# Patient Record
Sex: Female | Born: 1935 | Race: White | Hispanic: No | Smoking: Never smoker
Health system: Southern US, Community
[De-identification: ages and names within clinical notes are randomized; demographics above are authoritative.]

## PROBLEM LIST (undated history)

## (undated) DIAGNOSIS — M25561 Pain in right knee: Secondary | ICD-10-CM

## (undated) DIAGNOSIS — K649 Unspecified hemorrhoids: Secondary | ICD-10-CM

## (undated) DIAGNOSIS — Z0000 Encounter for general adult medical examination without abnormal findings: Secondary | ICD-10-CM

## (undated) DIAGNOSIS — R413 Other amnesia: Secondary | ICD-10-CM

## (undated) DIAGNOSIS — L219 Seborrheic dermatitis, unspecified: Secondary | ICD-10-CM

## (undated) DIAGNOSIS — I10 Essential (primary) hypertension: Secondary | ICD-10-CM

## (undated) DIAGNOSIS — L729 Follicular cyst of the skin and subcutaneous tissue, unspecified: Secondary | ICD-10-CM

## (undated) DIAGNOSIS — N390 Urinary tract infection, site not specified: Secondary | ICD-10-CM

## (undated) HISTORY — DX: Urinary tract infection, site not specified: N39.0

## (undated) HISTORY — DX: Seborrheic dermatitis, unspecified: L21.9

## (undated) HISTORY — DX: Encounter for general adult medical examination without abnormal findings: Z00.00

## (undated) HISTORY — PX: OTHER SURGICAL HISTORY: SHX169

## (undated) HISTORY — DX: Unspecified hemorrhoids: K64.9

## (undated) HISTORY — DX: Pain in right knee: M25.561

## (undated) HISTORY — DX: Follicular cyst of the skin and subcutaneous tissue, unspecified: L72.9

## (undated) HISTORY — DX: Other amnesia: R41.3

## (undated) HISTORY — DX: Essential (primary) hypertension: I10

---

## 2010-09-16 ENCOUNTER — Encounter: Payer: Self-pay | Admitting: Internal Medicine

## 2010-09-16 ENCOUNTER — Ambulatory Visit
Admission: RE | Admit: 2010-09-16 | Discharge: 2010-09-16 | Payer: Self-pay | Source: Home / Self Care | Attending: Family Medicine | Admitting: Family Medicine

## 2010-09-16 DIAGNOSIS — I10 Essential (primary) hypertension: Secondary | ICD-10-CM | POA: Insufficient documentation

## 2010-09-16 DIAGNOSIS — I83893 Varicose veins of bilateral lower extremities with other complications: Secondary | ICD-10-CM | POA: Insufficient documentation

## 2010-09-16 DIAGNOSIS — D126 Benign neoplasm of colon, unspecified: Secondary | ICD-10-CM | POA: Insufficient documentation

## 2010-09-16 DIAGNOSIS — K4020 Bilateral inguinal hernia, without obstruction or gangrene, not specified as recurrent: Secondary | ICD-10-CM | POA: Insufficient documentation

## 2010-09-16 DIAGNOSIS — M818 Other osteoporosis without current pathological fracture: Secondary | ICD-10-CM | POA: Insufficient documentation

## 2010-09-16 LAB — CONVERTED CEMR LAB
BUN: 29 mg/dL — ABNORMAL HIGH (ref 6–23)
Basophils Relative: 1 % (ref 0–1)
Calcium: 9.9 mg/dL (ref 8.4–10.5)
Creatinine, Ser: 0.87 mg/dL (ref 0.40–1.20)
Eosinophils Absolute: 0.2 10*3/uL (ref 0.0–0.7)
Eosinophils Relative: 3 % (ref 0–5)
Hemoglobin: 13 g/dL (ref 12.0–15.0)
MCHC: 33.2 g/dL (ref 30.0–36.0)
MCV: 86.3 fL (ref 78.0–100.0)
Monocytes Absolute: 0.7 10*3/uL (ref 0.1–1.0)
Monocytes Relative: 9 % (ref 3–12)
RBC: 4.53 M/uL (ref 3.87–5.11)

## 2010-09-17 DIAGNOSIS — M810 Age-related osteoporosis without current pathological fracture: Secondary | ICD-10-CM | POA: Insufficient documentation

## 2010-09-19 ENCOUNTER — Telehealth: Payer: Self-pay

## 2010-09-29 NOTE — Assessment & Plan Note (Signed)
Summary: new to est//ccm   Vital Signs:  Patient profile:   77 year old female Menstrual status:  postmenopausal Height:      61.5 inches Weight:      120 pounds BMI:     22.39 Pulse rate:   84 / minute BP sitting:   116 / 80  (left arm)  Vitals Entered By: Kyung Rudd, CMA (September 16, 2010 2:55 PM) CC: NP     Menstrual Status postmenopausal   CC:  NP.  History of Present Illness: Pt presents to clinic to establish primary care. H/o HTN well controlled. Compliant with medication without adverse effect. Notes previous hypokalemia requiring k supplementation. H/o bilateral inguinal hernias without pain. Recall colonoscopy  ~5 y ago with "benign" polyps and believes may be due. May not be interested in repeating procedure at this time.  Was dx'ed with osteoporosis with last BMD ? ~2011. No recent fx hx. Was previously tx'ed with fosamax however states this was dc'ed. Denies having had side effects from medication. Has bilateral le venous varicosities R>L with mild intermittent swelling without pain. No other complaints  Preventive Screening-Counseling & Management  Alcohol-Tobacco     Smoking Status: never     Smoking Cessation Counseling: no     Tobacco Counseling: not indicated; no tobacco use  Caffeine-Diet-Exercise     Does Patient Exercise: yes  Problems Prior to Update: 1)  Long-term (CURRENT) Use of Other Medications  (ICD-V58.69) 2)  Hypertension  (ICD-401.9) 3)  Family History Diabetes 1st Degree Relative  (ICD-V18.0)  Current Problems (verified): 1)  Colonic Polyps, Benign  (ICD-211.3) 2)  Varicose Veins Lower Extremities W/oth Comps  (ICD-454.8) 3)  Other Osteoporosis  (ICD-733.09) 4)  Long-term (CURRENT) Use of Other Medications  (ICD-V58.69) 5)  Hypertension  (ICD-401.9) 6)  Family History Diabetes 1st Degree Relative  (ICD-V18.0)  Allergies (verified): 1)  ! Penicillin V Potassium (Penicillin V Potassium)  Past History:  Past Medical  History: Hypertension Osteoporosis  Family History: Family History Diabetes   Social History: Retired Conservation officer, nature Never Smoked Alcohol use-yes Regular exercise-yes Smoking Status:  never Does Patient Exercise:  yes  Review of Systems      See HPI General:  Denies chills, fever, and sweats. Eyes:  Denies eye irritation, eye pain, and red eye. ENT:  Denies decreased hearing, difficulty swallowing, and earache. CV:  Complains of swelling of feet; denies chest pain or discomfort, fainting, fatigue, and near fainting. Resp:  Denies chest discomfort, cough, and shortness of breath. GI:  Denies abdominal pain, bloody stools, change in bowel habits, and loss of appetite. GU:  Denies dysuria, incontinence, and nocturia. MS:  Denies joint pain, joint redness, joint swelling, and cramps. Derm:  Denies changes in color of skin, flushing, and rash. Neuro:  Denies difficulty with concentration, disturbances in coordination, falling down, and headaches. Psych:  Denies anxiety, depression, and mental problems. Endo:  Denies cold intolerance, heat intolerance, and polyuria. Heme:  Denies abnormal bruising, bleeding, and fevers. Allergy:  Denies hives or rash, persistent infections, and seasonal allergies.  Physical Exam  General:  Well-developed,well-nourished,in no acute distress; alert,appropriate and cooperative throughout examination Head:  Normocephalic and atraumatic without obvious abnormalities. No apparent alopecia or balding. Eyes:  pupils equal, pupils round, corneas and lenses clear, and no injection.   Ears:  no external deformities.   Nose:  no external deformity, no external erythema, and no nasal discharge.   Mouth:  Oral mucosa and oropharynx without lesions or exudates.  Teeth in good  repair. Neck:  No deformities, masses, or tenderness noted. Lungs:  Normal respiratory effort, chest expands symmetrically. Lungs are clear to auscultation, no crackles or wheezes. Heart:   Normal rate and regular rhythm. S1 and S2 normal without gallop, murmur, click, rub or other extra sounds. Abdomen:  Bowel sounds positive,abdomen soft and non-tender without masses, organomegaly or hernias noted. +bilateral inguinal hernias easily reducible. NT without erythema. No inguinal LN palpable Msk:  normal ROM, no joint swelling, and no joint warmth.   Extremities:  trace left pedal edema.  +venous varicosities R>L. No warmth, redness or wounds Neurologic:  alert & oriented X3 and gait normal.   Skin:  turgor normal, color normal, and no rashes.   Cervical Nodes:  No lymphadenopathy noted Inguinal Nodes:  No significant adenopathy Psych:  Oriented X3, normally interactive, good eye contact, not anxious appearing, and not depressed appearing.     Impression & Recommendations:  Problem # 1:  HYPERTENSION (ICD-401.9) Assessment Unchanged Normotensive and stable. Continue current regimen. Obtain cbc, chem7.  Her updated medication list for this problem includes:    Atenolol-chlorthalidone 50-25 Mg Tabs (Atenolol-chlorthalidone) .Marland Kitchen... 1/2 tab once daily  Orders: TLB-CBC Platelet - w/Differential (85025-CBCD) Specimen Handling (04540) Venipuncture (98119)  Problem # 2:  OSTEOPOROSIS (ICD-733.00) Assessment: Unchanged No recent fx hx off bisphosphonate. Reviewed ca/vit d dosing. BMD reportedly utd 2011  Problem # 3:  INGUINAL HERNIAS, BILATERAL (ICD-550.92) Assessment: Unchanged Stable and asx. No current indication for surgery. Reviewed protential warning signs to prompt immediate medical evaluation  Problem # 4:  COLONIC POLYPS, BENIGN (ICD-211.3) Assessment: Unchanged ? due for repeat colonoscopy. Pt currently weighing whether to proceed. Given advanced age did discuss further colonoscopies unlikely to contribute to longevity.  Complete Medication List: 1)  Atenolol-chlorthalidone 50-25 Mg Tabs (Atenolol-chlorthalidone) .... 1/2 tab once daily  Other Orders: TLB-BMP  (Basic Metabolic Panel-BMET) (80048-METABOL)  Patient Instructions: 1)  Please schedule a follow-up appointment in 3 months. Prescriptions: ATENOLOL-CHLORTHALIDONE 50-25 MG TABS (ATENOLOL-CHLORTHALIDONE) 1/2 tab once daily  #30 x 3   Entered by:   Kyung Rudd, CMA   Authorized by:   Edwyna Perfect MD   Signed by:   Kyung Rudd, CMA on 09/16/2010   Method used:   Printed then faxed to ...         RxID:   [id number]    Orders Added: 1)  TLB-CBC Platelet - w/Differential [85025-CBCD] 2)  TLB-BMP (Basic Metabolic Panel-BMET) [80048-METABOL] 3)  Specimen Handling [99000] 4)  Venipuncture [84696] 5)  New Patient Level IV [29528]

## 2010-09-29 NOTE — Progress Notes (Signed)
----   Converted from flag ---- ---- 09/17/2010 12:27 PM, Edwyna Perfect MD wrote: labs nl including potassium. ------------------------------  Phone Note Outgoing Call   Call placed by: Kyung Rudd, CMA,  September 19, 2010 9:25 AM Call placed to: Patient Details for Reason: lab results Summary of Call: left message to call back Initial call taken by: Kyung Rudd, CMA,  September 19, 2010 9:25 AM  Follow-up for Phone Call        pt aware Follow-up by: Kyung Rudd, CMA,  September 19, 2010 4:35 PM

## 2010-11-11 ENCOUNTER — Encounter: Payer: Self-pay | Admitting: Internal Medicine

## 2010-12-20 ENCOUNTER — Ambulatory Visit (INDEPENDENT_AMBULATORY_CARE_PROVIDER_SITE_OTHER): Payer: Medicare Other | Admitting: Internal Medicine

## 2010-12-20 ENCOUNTER — Encounter: Payer: Self-pay | Admitting: Internal Medicine

## 2010-12-20 VITALS — BP 122/80 | HR 63 | Wt 121.0 lb

## 2010-12-20 DIAGNOSIS — K4020 Bilateral inguinal hernia, without obstruction or gangrene, not specified as recurrent: Secondary | ICD-10-CM

## 2010-12-20 DIAGNOSIS — I10 Essential (primary) hypertension: Secondary | ICD-10-CM

## 2010-12-20 DIAGNOSIS — I8390 Asymptomatic varicose veins of unspecified lower extremity: Secondary | ICD-10-CM

## 2010-12-20 DIAGNOSIS — I83893 Varicose veins of bilateral lower extremities with other complications: Secondary | ICD-10-CM

## 2010-12-20 DIAGNOSIS — M810 Age-related osteoporosis without current pathological fracture: Secondary | ICD-10-CM

## 2010-12-20 DIAGNOSIS — H01009 Unspecified blepharitis unspecified eye, unspecified eyelid: Secondary | ICD-10-CM

## 2010-12-20 MED ORDER — SULFACETAMIDE-PREDNISOLONE 10-0.2 % OP OINT: TOPICAL_OINTMENT | Freq: Three times a day (TID) | OPHTHALMIC | Status: AC

## 2010-12-20 MED ORDER — ATENOLOL-CHLORTHALIDONE 50-25 MG PO TABS: 0.5000 | ORAL_TABLET | Freq: Every day | ORAL | Status: DC

## 2010-12-24 ENCOUNTER — Encounter: Payer: Self-pay | Admitting: Internal Medicine

## 2010-12-24 DIAGNOSIS — H01009 Unspecified blepharitis unspecified eye, unspecified eyelid: Secondary | ICD-10-CM | POA: Insufficient documentation

## 2010-12-24 NOTE — Assessment & Plan Note (Signed)
Normotensive and stable. Continue current regimen. 

## 2010-12-24 NOTE — Assessment & Plan Note (Signed)
Asymptomatic, stable and reducible. Recommend observation. Warning signs and symptoms provided.

## 2010-12-24 NOTE — Assessment & Plan Note (Signed)
Attempt Blephamide.Followup if no improvement or worsening.

## 2010-12-24 NOTE — Progress Notes (Signed)
  Subjective:    Patient ID: Sheryl Lang, female    DOB: 1920/11/07, 77 y.o.   MRN: [id number]  HPI Patient presents to clinic for evaluation of leg pain. Patient with chronic history of right leg greater than left leg varicosities. Has associated pain and swelling. Exacerbated by standing. Blood pressure viewed as normal and weight is stable. Has left inguinal hernia remains asymptomatic without pain redness firmness or warmth. Reviewed normal CBC and Chem- 7 from last visit. 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 Previously received topical treatment which improved the symptoms. Does not have the medication any longer. No other complaints.  Reviewed past medical history, medications and allergies    Review of Systems ee history of present illness     Objective:   Physical Exam   Physical Exam  Vitals reviewed. Constitutional:  appears well-developed and well-nourished. No distress.  HENT:  Head: Normocephalic and atraumatic.  Right Ear: Tympanic membrane, external ear and ear canal normal.  Left Ear: Tympanic membrane, external ear and ear canal normal.  Nose: Nose normal.  Mouth/Throat: Oropharynx is clear and moist. No oropharyngeal exudate.  Eyes: Conjunctivae and EOM are normal. Pupils are equal, round, and reactive to light. Right eye exhibits no discharge. Left eye exhibits no discharge. No scleral icterus.  Neck: Neck supple. No thyromegaly present.  Cardiovascular: Normal rate, regular rhythm and normal heart sounds.  Exam reveals no gallop and no friction rub.   No murmur heard. Pulmonary/Chest: Effort normal and breath sounds normal. No respiratory distress.  has no wheezes.  has no rales.  Lymphadenopathy:   no cervical adenopathy.  Neurological:  is alert.  Skin: Skin is warm and dry.  not diaphoretic.  Psychiatric: normal mood and affect.  Extremities: Right lower leg demonstrates multiple significant varicosities without warmth.  Positive mild tenderness.No palpable cords GU: Left inguinal hernia noted on Valsalva. Easily reduced. No tenderness warmth or firmness noted   Assessment & Plan:

## 2010-12-24 NOTE — Assessment & Plan Note (Signed)
Symptomatic varicose veins.Schedule vascular surgery consult

## 2011-02-01 ENCOUNTER — Encounter: Payer: 59 | Admitting: Vascular Surgery

## 2011-02-01 ENCOUNTER — Telehealth: Payer: Self-pay | Admitting: Internal Medicine

## 2011-02-01 NOTE — Telephone Encounter (Signed)
Pt is needing to get a referral to a podiatrist re: seed corns on soles of feet. Pls advise who pcp would recommend.

## 2011-02-06 ENCOUNTER — Other Ambulatory Visit: Payer: Self-pay | Admitting: Internal Medicine

## 2011-02-06 DIAGNOSIS — L84 Corns and callosities: Secondary | ICD-10-CM

## 2011-02-06 NOTE — Telephone Encounter (Signed)
Referral order placed.

## 2011-02-07 ENCOUNTER — Encounter: Payer: 59 | Admitting: Vascular Surgery

## 2011-03-29 ENCOUNTER — Encounter: Payer: Self-pay | Admitting: Podiatry

## 2011-04-06 ENCOUNTER — Ambulatory Visit (INDEPENDENT_AMBULATORY_CARE_PROVIDER_SITE_OTHER): Payer: Medicare Other | Admitting: Family Medicine

## 2011-04-06 ENCOUNTER — Telehealth: Payer: Self-pay | Admitting: Internal Medicine

## 2011-04-06 ENCOUNTER — Encounter: Payer: Self-pay | Admitting: Family Medicine

## 2011-04-06 DIAGNOSIS — R413 Other amnesia: Secondary | ICD-10-CM

## 2011-04-06 DIAGNOSIS — G4700 Insomnia, unspecified: Secondary | ICD-10-CM

## 2011-04-06 DIAGNOSIS — I10 Essential (primary) hypertension: Secondary | ICD-10-CM

## 2011-04-06 DIAGNOSIS — M549 Dorsalgia, unspecified: Secondary | ICD-10-CM

## 2011-04-06 DIAGNOSIS — N390 Urinary tract infection, site not specified: Secondary | ICD-10-CM

## 2011-04-06 LAB — POCT URINALYSIS DIPSTICK
Bilirubin, UA: NEGATIVE
Glucose, UA: NEGATIVE
Nitrite, UA: NEGATIVE
Spec Grav, UA: 1.01

## 2011-04-06 MED ORDER — IBUPROFEN 200 MG PO TABS: ORAL_TABLET | ORAL | Status: DC

## 2011-04-06 MED ORDER — DIPHENHYDRAMINE HCL 25 MG PO CAPS: 25.0000 mg | ORAL_CAPSULE | Freq: Every evening | ORAL | Status: DC | PRN

## 2011-04-06 MED ORDER — ASPIRIN EC 81 MG PO TBEC: 81.0000 mg | DELAYED_RELEASE_TABLET | Freq: Every day | ORAL | Status: AC

## 2011-04-06 MED ORDER — ACETAMINOPHEN 500 MG PO TABS: ORAL_TABLET | ORAL | Status: DC

## 2011-04-06 MED ORDER — CIPROFLOXACIN HCL 250 MG PO TABS: ORAL_TABLET | ORAL | Status: AC

## 2011-04-06 NOTE — Telephone Encounter (Signed)
Yesterday late afternoon patient called  Her daughter and said she was all mixed up.  Was confused about what she had done that day and she could not remember her friends at the home where she lives.  Her memory is not great to begin with but this was not the same,  It was an abrupt change in memory.  Mother said she felt fine and had no headache and no slack face.  She sounds better this am but is still not back to where she should be.  Since Dr Rodena Medin is not in should daughter take her to see Dr Abner Greenspan today .  Mom is going to the nurse at [facility] and will have her blood pressure checked.  Please advise what to do.

## 2011-04-06 NOTE — Patient Instructions (Signed)
Urinary Tract Infection (UTI) Infections of the urinary tract can start in several places. A bladder infection (cystitis), a kidney infection (pyelonephritis), and a prostate infection (prostatitis) are different types of urinary tract infections. They usually get better if treated with medicines (antibiotics) that kill germs. Take all the medicine until it is gone. You or your child may feel better in a few days, but TAKE ALL MEDICINE or the infection may not respond and may become more difficult to treat. HOME CARE INSTRUCTIONS  Drink enough water and fluids to keep the urine clear or pale yellow. Cranberry juice is especially recommended, in addition to large amounts of water.   Avoid caffeine, tea, and carbonated beverages. They tend to irritate the bladder.   Alcohol may irritate the prostate.   Only take over-the-counter or prescription medicines for pain, discomfort, or fever as directed by your caregiver.  FINDING OUT THE RESULTS OF YOUR TEST Not all test results are available during your visit. If your or your child's test results are not back during the visit, make an appointment with your caregiver to find out the results. Do not assume everything is normal if you have not heard from your caregiver or the medical facility. It is important for you to follow up on all test results. TO PREVENT FURTHER INFECTIONS:  Empty the bladder often. Avoid holding urine for long periods of time.   After a bowel movement, women should cleanse from front to back. Use each tissue only once.   Empty the bladder before and after sexual intercourse.  SEEK MEDICAL CARE IF:  There is back pain..   Your baby is older than 3 months with a rectal temperature of 100.5 F (38.1 C) or higher for more than 1 day.   Your or your child's problems (symptoms) are no better in 3 days. Return sooner if you or your child is getting worse.  SEEK IMMEDIATE MEDICAL CARE IF:  There is severe back pain or lower  abdominal pain  Your baby is older than 3 months with a rectal temperature of 102 F (38.9 C) or higher.   Your baby is 91 months old or younger with a rectal temperature of 100.4 F (38 C) or higher.   There is nausea or vomiting.   There is continued burning or discomfort with urination.  MAKE SURE YOU:  Understand these instructions.   Will watch this condition.   Will get help right away if you or your child is not doing well or gets worse.  Document Released: 05/24/2005 Document Re-Released: 11/08/2009 [facility] Patient Information 2011 [place], [place].  Tylenol/Acetaminophen 500mg  tabs, max of 4 tabs in 24 hours Advil/Motrin/Ibuprofen 200mg  only for severe pain, max of 2 tabs in 24 hours

## 2011-04-06 NOTE — Telephone Encounter (Signed)
Call returned to [phone], spoke with patients daughter and stated she is having trouble with her short term memory, she denies any physical changes, dizziness, nausea, and no vomiting. She states patient seems better today, however there is still some noticeable confusion. Patients daughter was advised to have her evaluated in ER of office visit. Sheryl Lang has verbalized understanding and agrees to have patient evaluated. She was informed Dr Rodena Medin was not in office today, and was offered appointment with another provider, which she has accepted.

## 2011-04-07 ENCOUNTER — Encounter: Payer: Self-pay | Admitting: Family Medicine

## 2011-04-07 DIAGNOSIS — R413 Other amnesia: Secondary | ICD-10-CM

## 2011-04-07 HISTORY — DX: Other amnesia: R41.3

## 2011-04-07 NOTE — Assessment & Plan Note (Addendum)
Patient is brought in by her daughter state secondary to an episode of confusion yesterday. 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 Has had slow study memory loss but yesterday had acute onset of confusion. Somewhere mid afternoon she took a nap and woke up very confused. Was unable to tell her daughter who she had lunch with. Could not remember who her neighbor was. This lasted a couple of hours and then cleared. Today she is clear there was one episode in March with similar symptoms occurred. Likely multifactorial and not urine dip is consistent with UTI we will treat this and they will notify the office if symptoms worsen or recur. We'll seek immediate care issues memory lapses of other neurologic symptoms are concerning. We will start an 81 mg aspirin daily in the meantime and consider further workup if symptoms worsen.

## 2011-04-07 NOTE — Assessment & Plan Note (Signed)
Adequately controlled today, no changes to regimen

## 2011-04-07 NOTE — Progress Notes (Signed)
Sheryl Lang [id number] [birth year] 04/07/2011      Progress Note-Follow Up  Subjective  Chief Complaint  Chief Complaint  Patient presents with  . Memory Loss    sudden loss/ confusion (yesterday)    HPI  Patient is an 77 year old Caucasian female was brought in today by her daughter. They're concerned about an episode of transient memory loss yesterday. He reports home she seemed normal in the morning went to lunch in the afternoon and intubated. Upon awakening from her neck she was confused and disoriented. She went to lunch with her neighbor was. This lasted several hours and then cleared and she's had no recurrence. They do believe a similar episode occurred back in March of 2012 they don't think it lasted quite as long. She's had no falls or trauma. She offers no other neurologic complaints such as difficulty with word finding, headaches, sensory loss or falls. She is noting some urinary urgency upon questioning but denies dysuria or any new urinary symptoms otherwise. She does trouble with constipation at times but this is not notably worsened lately. No diarrhea, fevers, chills, chest pain, palpitations, shortness of breath. She does note he takes occasional Benadryl about half a tablet at bedtime when she has trouble sleeping but had not taken his prior to this episode.  Past Medical History  Diagnosis Date  . Osteoporosis   . Hypertension   . Transient memory loss 04/07/2011    History reviewed. No pertinent past surgical history.  Family History  Problem Relation Age of Onset  . Diabetes      family hx    History   Social History  . Marital Status: Widowed    Spouse Name: N/A    Number of Children: N/A  . Years of Education: N/A   Occupational History  . Not on file.   Social History Main Topics  . Smoking status: Never Smoker   . Smokeless tobacco: Not on file  . Alcohol Use: Yes  . Drug Use:   . Sexually Active:    Other Topics Concern  . Not on file    Social History Narrative  . No narrative on file    Current Outpatient Prescriptions on File Prior to Visit  Medication Sig Dispense Refill  . atenolol-chlorthalidone (TENORETIC) 50-25 MG per tablet Take 0.5 tablets by mouth daily.  90 tablet  3    Allergies  Allergen Reactions  . Penicillins     Review of Systems  Review of Systems  Constitutional: Negative for fever, chills and malaise/fatigue.  HENT: Negative for hearing loss, congestion, sore throat and tinnitus.   Eyes: Negative for discharge.  Respiratory: Negative for shortness of breath.   Cardiovascular: Negative for chest pain, palpitations and leg swelling.  Gastrointestinal: Negative for nausea, abdominal pain and diarrhea.  Genitourinary: Positive for urgency. Negative for dysuria, frequency and hematuria.  Musculoskeletal: Negative for falls.  Skin: Negative for rash.  Neurological: Negative for dizziness, tingling, tremors, sensory change, speech change, focal weakness, seizures, loss of consciousness, weakness and headaches.  Endo/Heme/Allergies: Negative for polydipsia.  Psychiatric/Behavioral: Positive for memory loss. Negative for depression and suicidal ideas. The patient has insomnia. The patient is not nervous/anxious.        Transient and last several hours yesterday. A and O x 3 today    Objective  BP 130/77  Pulse 54  Temp(Src) 97.9 F (36.6 C) (Oral)  Ht 5' 1.5" (1.562 m)  Wt 125 lb 12.8 oz (57.063 kg)  BMI 23.38 kg/m2  SpO2 97%  Physical Exam  Physical Exam  Constitutional: She is oriented to person, place, and time and well-developed, well-nourished, and in no distress. No distress.  HENT:  Head: Normocephalic and atraumatic.  Eyes: Conjunctivae are normal.  Neck: Neck supple. No thyromegaly present.  Cardiovascular: Normal rate, regular rhythm and normal heart sounds.   No murmur heard. Pulmonary/Chest: Effort normal and breath sounds normal. She has no wheezes.  Abdominal: Bowel  sounds are normal. She exhibits no distension and no mass. There is no tenderness.  Musculoskeletal: She exhibits no edema.  Lymphadenopathy:    She has no cervical adenopathy.  Neurological: She is alert and oriented to person, place, and time. She has normal reflexes. She displays normal reflexes. No cranial nerve deficit. Gait normal. Coordination normal.  Skin: Skin is warm and dry. No rash noted. She is not diaphoretic.  Psychiatric: Mood, memory, affect and judgment normal.    No results found for this basename: TSH   Lab Results  Component Value Date   WBC 7.0 09/16/2010   HGB 13.0 09/16/2010   HCT 39.1 09/16/2010   MCV 86.3 09/16/2010   PLT 240 09/16/2010   Lab Results  Component Value Date   CREATININE 0.87 09/16/2010   BUN 29* 09/16/2010   NA 140 09/16/2010   K 4.0 09/16/2010   CL 99 09/16/2010   CO2 29 09/16/2010    Assessment & Plan  Transient memory loss Patient is brought in by her daughter state secondary to an episode of confusion yesterday. 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 Has had slow study memory loss but yesterday had acute onset of confusion. Somewhere mid afternoon she took a nap and woke up very confused. Was unable to tell her daughter who she had lunch with. Could not remember who her neighbor was. This lasted a couple of hours and then cleared. Today she is clear there was one episode in March with similar symptoms occurred. Likely multifactorial and not urine dip is consistent with UTI we will treat this and they will notify the office if symptoms worsen or recur. We'll seek immediate care issues memory lapses of other neurologic symptoms are concerning. We will start an 81 mg aspirin daily in the meantime and consider further workup if symptoms worsen.   HYPERTENSION Adequately controlled today, no changes to regimen

## 2011-04-18 ENCOUNTER — Encounter: Payer: Self-pay | Admitting: *Deleted

## 2011-04-21 ENCOUNTER — Encounter: Payer: Self-pay | Admitting: Internal Medicine

## 2011-04-21 ENCOUNTER — Ambulatory Visit (INDEPENDENT_AMBULATORY_CARE_PROVIDER_SITE_OTHER): Payer: Medicare Other | Admitting: Internal Medicine

## 2011-04-21 DIAGNOSIS — F29 Unspecified psychosis not due to a substance or known physiological condition: Secondary | ICD-10-CM

## 2011-04-21 DIAGNOSIS — Z79899 Other long term (current) drug therapy: Secondary | ICD-10-CM

## 2011-04-21 DIAGNOSIS — I10 Essential (primary) hypertension: Secondary | ICD-10-CM

## 2011-04-21 DIAGNOSIS — R413 Other amnesia: Secondary | ICD-10-CM | POA: Insufficient documentation

## 2011-04-21 DIAGNOSIS — R410 Disorientation, unspecified: Secondary | ICD-10-CM

## 2011-04-21 LAB — BASIC METABOLIC PANEL WITH GFR
BUN: 26 mg/dL — ABNORMAL HIGH (ref 6–23)
CO2: 28 meq/L (ref 19–32)
Calcium: 10.7 mg/dL — ABNORMAL HIGH (ref 8.4–10.5)
Chloride: 99 meq/L (ref 96–112)
Creat: 0.91 mg/dL (ref 0.50–1.10)
Glucose, Bld: 93 mg/dL (ref 70–99)
Potassium: 4 meq/L (ref 3.5–5.3)
Sodium: 136 meq/L (ref 135–145)

## 2011-04-21 LAB — HEPATIC FUNCTION PANEL
ALT: 35 U/L (ref 0–35)
AST: 37 U/L (ref 0–37)
Albumin: 4.6 g/dL (ref 3.5–5.2)
Alkaline Phosphatase: 61 U/L (ref 39–117)
Total Protein: 7.3 g/dL (ref 6.0–8.3)

## 2011-04-21 LAB — VITAMIN B12: Vitamin B-12: 912 pg/mL — ABNORMAL HIGH (ref 211–911)

## 2011-04-21 LAB — CBC
HCT: 40.4 % (ref 36.0–46.0)
Hemoglobin: 13.4 g/dL (ref 12.0–15.0)
MCH: 28.7 pg (ref 26.0–34.0)
MCHC: 33.2 g/dL (ref 30.0–36.0)
MCV: 86.5 fL (ref 78.0–100.0)
Platelets: 254 K/uL (ref 150–400)
RBC: 4.67 MIL/uL (ref 3.87–5.11)
RDW: 14.2 % (ref 11.5–15.5)
WBC: 7.2 K/uL (ref 4.0–10.5)

## 2011-04-21 MED ORDER — ATENOLOL-CHLORTHALIDONE 50-25 MG PO TABS: 0.5000 | ORAL_TABLET | Freq: Every day | ORAL | Status: DC

## 2011-04-21 NOTE — Assessment & Plan Note (Signed)
Resolved. Possible contribution from uti but urine cx unremarkable. Consider possible tia. Continue asa. Neurologically stable and asx.

## 2011-04-21 NOTE — Assessment & Plan Note (Signed)
Stable and felt to be mild by patient and family. Not interested in medication. Obtain b12 level.

## 2011-04-21 NOTE — Progress Notes (Signed)
  Subjective:    Patient ID: Sheryl Lang, female    DOB: Jan 15, 1921, 77 y.o.   MRN: [id number]  HPI Pt presents to clinic for followup of multiple medical problems. Several weeks ago experienced several hours of confusion after awakening from a nap. No associated neurologic deficits. Next day returned to baseline. No further episodes. Was evaluated by Dr. Abner Greenspan with suspicion for possible uti. Completed course of cipro and now takes asa 81mg  daily as well. Does have chronic memory loss but felt to be mild by pt and her daughter. BP reviewed under good control. Compliant with medication without adverse effect. No other complaints.  Past Medical History  Diagnosis Date  . Osteoporosis   . Hypertension   . Transient memory loss 04/07/2011   No past surgical history on file.  reports that she has never smoked. She has never used smokeless tobacco. She reports that she drinks alcohol. Her drug history not on file. family history includes Diabetes in an unspecified family member. Allergies  Allergen Reactions  . Penicillins      Review of Systems see hpi     Objective:   Physical Exam  Physical Exam  Nursing note and vitals reviewed. Constitutional: Appears well-developed and well-nourished. No distress.  HENT:  Head: Normocephalic and atraumatic.  Right Ear: External ear normal.  Left Ear: External ear normal.  Eyes: Conjunctivae are normal. No scleral icterus.  Neck: Neck supple. Carotid bruit is not present.  Cardiovascular: Normal rate, regular rhythm and normal heart sounds.  Exam reveals no gallop and no friction rub.   No murmur heard. Pulmonary/Chest: Effort normal and breath sounds normal. No respiratory distress. He has no wheezes. no rales.  Lymphadenopathy:    He has no cervical adenopathy.  Neurological:Alert.  Skin: Skin is warm and dry. Not diaphoretic.  Psychiatric: Has a normal mood and affect.        Assessment & Plan:

## 2011-04-21 NOTE — Assessment & Plan Note (Addendum)
Normotensive and stable. Continue current regimen. Monitor bp as outpt and followup in clinic as scheduled. Obtain cbc and chem7 

## 2011-04-24 ENCOUNTER — Other Ambulatory Visit: Payer: Self-pay | Admitting: Internal Medicine

## 2011-04-24 ENCOUNTER — Telehealth: Payer: Self-pay | Admitting: Internal Medicine

## 2011-04-24 NOTE — Telephone Encounter (Signed)
Patient listened to detailed message that you left her but she is a little confused. She wants to know if it will be okay to wait to do labs again for calcium until her next appt in January.

## 2011-04-25 NOTE — Telephone Encounter (Signed)
Call placed to patient at [phone], she was informed per Dr Rodena Medin instruction. Patient stated that she will go ahead and have her blood drawn as previously advised.

## 2011-04-25 NOTE — Telephone Encounter (Signed)
Dr Rodena Medin is it okay for patient to wait on  Lab draw for Calcium until January 2013?

## 2011-04-25 NOTE — Telephone Encounter (Signed)
Wouldn't wait until next visit

## 2011-05-09 ENCOUNTER — Ambulatory Visit (INDEPENDENT_AMBULATORY_CARE_PROVIDER_SITE_OTHER): Payer: Medicare Other | Admitting: Internal Medicine

## 2011-05-09 ENCOUNTER — Encounter: Payer: Self-pay | Admitting: Internal Medicine

## 2011-05-09 DIAGNOSIS — Z23 Encounter for immunization: Secondary | ICD-10-CM

## 2011-05-09 DIAGNOSIS — M549 Dorsalgia, unspecified: Secondary | ICD-10-CM | POA: Insufficient documentation

## 2011-05-09 MED ORDER — CYCLOBENZAPRINE HCL 5 MG PO TABS: ORAL_TABLET | ORAL | Status: DC

## 2011-05-09 NOTE — Assessment & Plan Note (Signed)
Obtain UA. History and exam suggest muscle spasm. Continue topical heat prn. Use low dose flexeril with caution. Family will be present when pt takes medication and both are aware of potential for sedation. Followup if no improvement or worsening.

## 2011-05-09 NOTE — Progress Notes (Signed)
  Subjective:    Patient ID: Sheryl Lang, female    DOB: 1921/04/01, 77 y.o.   MRN: [id number]  HPI Pt presents to clinic as a work in for evaluation of back pain. Notes 5 day h/o right LBP without radiation, injury/trauma/fall, or urinary sx's. Heat helps some. Pain reproduced with movement. Not taking any medication. Reviewed recent incidental calcium elevation. No change in volume status or mental status. Requests flu vaccine and prescription for zostavax. No other complaints.   Past Medical History  Diagnosis Date  . Osteoporosis   . Hypertension   . Transient memory loss 04/07/2011   No past surgical history on file.  reports that she has never smoked. She has never used smokeless tobacco. She reports that she drinks alcohol. Her drug history not on file. family history includes Diabetes in an unspecified family member. Allergies  Allergen Reactions  . Penicillins      Review of Systems see hpi     Objective:   Physical Exam  Nursing note and vitals reviewed. Constitutional: She appears well-developed and well-nourished. No distress.  HENT:  Head: Normocephalic and atraumatic.  Eyes: Conjunctivae are normal. No scleral icterus.  Musculoskeletal:       No midline LS tenderness or bony abn. Right mid paraspinal muscle spasm and tenderness. Gait nl.   Neurological: She is alert.  Skin: Skin is warm and dry. She is not diaphoretic.  Psychiatric: She has a normal mood and affect.          Assessment & Plan:

## 2011-05-10 LAB — URINALYSIS
Hgb urine dipstick: NEGATIVE
Ketones, ur: NEGATIVE mg/dL
Nitrite: NEGATIVE
Protein, ur: NEGATIVE mg/dL
Urobilinogen, UA: 0.2 mg/dL (ref 0.0–1.0)

## 2011-05-10 LAB — CALCIUM, IONIZED: Calcium, Ion: 1.33 mmol/L — ABNORMAL HIGH (ref 1.12–1.32)

## 2011-05-15 ENCOUNTER — Telehealth: Payer: Self-pay | Admitting: *Deleted

## 2011-05-15 MED ORDER — ATENOLOL 50 MG PO TABS: 50.0000 mg | ORAL_TABLET | Freq: Every day | ORAL | Status: DC

## 2011-05-15 MED ORDER — CYCLOBENZAPRINE HCL 5 MG PO TABS: ORAL_TABLET | ORAL | Status: DC

## 2011-05-15 NOTE — Telephone Encounter (Signed)
Ok  #30 

## 2011-05-15 NOTE — Telephone Encounter (Signed)
Call placed to patient regarding lab results from last office visit with Dr Rodena Medin. She was informed per Dr Rodena Medin instructions and has verbalized understanding.   Patient stated that she is still having back spasms although not as frequent. She would like to know if Dr  Rodena Medin would refill the cyclobenzaprine for her.

## 2011-05-15 NOTE — Telephone Encounter (Signed)
Call placed to patient at [phone], she was informed of Rx change to pharmacy and medication refill. Lab orders entered for [facility] for October.

## 2011-05-18 ENCOUNTER — Telehealth: Payer: Self-pay | Admitting: Internal Medicine

## 2011-05-18 NOTE — Telephone Encounter (Signed)
Patient states that she usually goes through mail order pharmacy RDN? For meds but picked up refill for Atenolol from [facility]. She states that directions usually are 1/2 tablet but with this new refill directions say one tablet. Patient wants to make sure she is taking correct dosage.

## 2011-05-18 NOTE — Telephone Encounter (Signed)
Call placed to patient at [phone], she was informed per Dr Rodena Medin to take 1/2  Of Tenormin and to monitor her blood pressure. Patient has verbalized understanding and agrees as instructed.

## 2011-05-23 ENCOUNTER — Ambulatory Visit: Payer: 59 | Admitting: Internal Medicine

## 2011-09-19 ENCOUNTER — Ambulatory Visit: Payer: Medicare Other | Admitting: Internal Medicine

## 2011-10-03 ENCOUNTER — Telehealth: Payer: Self-pay | Admitting: Internal Medicine

## 2011-10-03 MED ORDER — ATENOLOL 50 MG PO TABS: ORAL_TABLET | ORAL | Status: DC

## 2011-10-03 NOTE — Telephone Encounter (Signed)
Refill sent to pharmacy.   

## 2011-10-17 ENCOUNTER — Encounter: Payer: Self-pay | Admitting: Internal Medicine

## 2011-10-17 ENCOUNTER — Ambulatory Visit (INDEPENDENT_AMBULATORY_CARE_PROVIDER_SITE_OTHER): Payer: Medicare Other | Admitting: Internal Medicine

## 2011-10-17 DIAGNOSIS — I10 Essential (primary) hypertension: Secondary | ICD-10-CM

## 2011-10-17 NOTE — Patient Instructions (Signed)
Please take either 1mg , 2mg  or 3mg  of melatonin for sleep. You may also try valerian which is available at most drugstores.

## 2011-10-17 NOTE — Progress Notes (Signed)
  Subjective:    Patient ID: Sheryl Lang, female    DOB: 12/31/1920, 78 y.o.   MRN: [id number]  HPI Pt presents to clinic for followup of multiple medical problems. Reviewed mild hypercalcemia. Changed tenoretic to tenormin but pt unsure if she changed. Has chronic insomnia taking unknown dose of melatonin and benadryl prn. BP nl and wt stable.  Past Medical History  Diagnosis Date  . Osteoporosis   . Hypertension   . Transient memory loss 04/07/2011   No past surgical history on file.  reports that she has never smoked. She has never used smokeless tobacco. She reports that she drinks alcohol. Her drug history not on file. family history includes Diabetes in an unspecified family member. Allergies  Allergen Reactions  . Penicillins       Review of Systems see hpi    Objective:   Physical Exam  Physical Exam  Nursing note and vitals reviewed. Constitutional: Appears well-developed and well-nourished. No distress.  HENT:  Head: Normocephalic and atraumatic.  Right Ear: External ear normal.  Left Ear: External ear normal.  Eyes: Conjunctivae are normal. No scleral icterus.  Neck: Neck supple. Carotid bruit is not present.  Cardiovascular: Normal rate, regular rhythm and normal heart sounds.  Exam reveals no gallop and no friction rub.   No murmur heard. Pulmonary/Chest: Effort normal and breath sounds normal. No respiratory distress. He has no wheezes. no rales.  Lymphadenopathy:    He has no cervical adenopathy.  Neurological:Alert.  Skin: Skin is warm and dry. Not diaphoretic.  Psychiatric: Has a normal mood and affect.        Assessment & Plan:

## 2011-10-17 NOTE — Assessment & Plan Note (Signed)
Check with pharmacy to see if pt changed tenoretic (pt unsure and did not bring medications). Obtain calcium, pth, vitamin d, phos and spep/upep

## 2011-10-17 NOTE — Assessment & Plan Note (Signed)
Normotensive. Check with pharmacy to determine last medication filled.

## 2011-10-18 LAB — PHOSPHORUS: Phosphorus: 3.9 mg/dL (ref 2.3–4.6)

## 2011-10-19 LAB — UIFE/LIGHT CHAINS/TP QN, 24-HR UR
Albumin, U: DETECTED
Alpha 1, Urine: DETECTED — AB
Beta, Urine: DETECTED — AB
Gamma Globulin, Urine: DETECTED — AB

## 2011-10-20 LAB — PROTEIN ELECTROPHORESIS, SERUM
Albumin ELP: 61 % (ref 55.8–66.1)
Alpha-1-Globulin: 4.9 % (ref 2.9–4.9)
Beta 2: 4 % (ref 3.2–6.5)
Beta Globulin: 6.6 % (ref 4.7–7.2)

## 2011-11-29 ENCOUNTER — Encounter: Payer: Self-pay | Admitting: Internal Medicine

## 2011-11-30 ENCOUNTER — Telehealth: Payer: Self-pay | Admitting: *Deleted

## 2011-11-30 NOTE — Telephone Encounter (Signed)
Patient called and left voice message stating she has had an intestinal virus for the past few days that is not relieved by pepto bismol. Her message stated the bug seems to be going around the facility.

## 2011-11-30 NOTE — Telephone Encounter (Signed)
Call placed to patient at [phone]. She states she has diarrhea up from 2 to 3 times per day for the past 7 days,with some relief from Pepto Bismol. She states she does not have any pain, no nausea, no vomiting, no bloody stools, no fever, no dizziness or light headedness. She stated after she placed the call, she seemed to be doing much better. She stated that she will wait another day to see how she feels and if there is no improvement she will call back.

## 2011-12-01 ENCOUNTER — Encounter: Payer: Self-pay | Admitting: Internal Medicine

## 2011-12-05 ENCOUNTER — Encounter: Payer: Self-pay | Admitting: Internal Medicine

## 2011-12-05 ENCOUNTER — Ambulatory Visit (INDEPENDENT_AMBULATORY_CARE_PROVIDER_SITE_OTHER): Payer: Medicare Other | Admitting: Internal Medicine

## 2011-12-05 VITALS — BP 92/60 | HR 62 | Temp 98.2°F | Resp 18 | Wt 112.0 lb

## 2011-12-05 DIAGNOSIS — R197 Diarrhea, unspecified: Secondary | ICD-10-CM | POA: Diagnosis not present

## 2011-12-05 DIAGNOSIS — I10 Essential (primary) hypertension: Secondary | ICD-10-CM

## 2011-12-05 NOTE — Assessment & Plan Note (Signed)
Asx. Decrease atenolol dose by half for one week.monitor bp

## 2011-12-05 NOTE — Progress Notes (Signed)
  Subjective:    Patient ID: Sheryl Lang, female    DOB: [birth year], 78 y.o.   MRN: [id number]  HPI Pt presents to clinic for evaluation of diarrhea. Notes onset of diarrhea ~8 days ago with first two days more severe followed by improvement. Taking pepto bismol prn. Denies fever, chills, abd pain, nausea, vomiting or blood in stool. BP mildly low without dizziness. No other alleviatin or exacerbating factors.  Past Medical History  Diagnosis Date  . Osteoporosis   . Hypertension   . Transient memory loss 04/07/2011   No past surgical history on file.  reports that she has never smoked. She has never used smokeless tobacco. She reports that she drinks alcohol. Her drug history not on file. family history includes Diabetes in an unspecified family member. Allergies  Allergen Reactions  . Penicillins      Review of Systems see hpi     Objective:   Physical Exam  Nursing note and vitals reviewed. Constitutional: She appears well-developed and well-nourished. No distress.  HENT:  Head: Normocephalic and atraumatic.  Abdominal: Soft. Bowel sounds are normal. She exhibits no distension and no mass. There is no tenderness. There is no rebound and no guarding.  Neurological: She is alert.  Skin: She is not diaphoretic.  Psychiatric: She has a normal mood and affect.          Assessment & Plan:

## 2011-12-05 NOTE — Assessment & Plan Note (Signed)
Consider viral gastroenteritis with relapse on resuming nl diet. Discussed bland diet avoiding dairy and slowly advancing. If sx's do not slowly improve over next 3 days then consider empiric abx course and stool studies.

## 2011-12-08 ENCOUNTER — Telehealth: Payer: Self-pay | Admitting: *Deleted

## 2011-12-08 MED ORDER — CIPROFLOXACIN HCL 500 MG PO TABS: 500.0000 mg | ORAL_TABLET | Freq: Two times a day (BID) | ORAL | Status: AC

## 2011-12-08 NOTE — Telephone Encounter (Signed)
cipro 500mg  bid x 5 days. Change pepto to imodium prn. Go to lab for stool studies -fecal leukocytes, culture, o&p and c. Diff toxin dx-diarrhea

## 2011-12-08 NOTE — Telephone Encounter (Signed)
Patient called stating she was seen last week for intestinal virus that she had for 6 days. She stated since the last office visit, she has followed Dr Rodena Medin instructions.She stated that her symptoms have improved slightly; however she stated yesterday she had 6 loose stools. Her diet has been ginger ale, bananas, applesauce and probiotics. She has use Pepto bismol, but has provided little relief. She would like to know if there is something Rx that she should take for her diarrhea.

## 2011-12-08 NOTE — Telephone Encounter (Signed)
Call placed to patient at [phone], no answer. A detailed voice message was left informing patient per Dr Rodena Medin instructions. A voice message was left advising patient to return phone call.   Call placed to [facility] with Virgina Evener at [phone], regarding collection of stool specimen for resident; no answer. A voice message was left to return phone call.

## 2011-12-08 NOTE — Telephone Encounter (Signed)
Patient returned phone call. She was informed per Dr Rodena Medin instructions, and has verbalized understanding. She was informed a voice message was left for Samantha to assist her in obtaining a stool specimen. Patient was advised to talk with Lelon Mast to see if a sterile container could be provided for stool studies. Patient stated that she will try to obtain specimen today, if not she will try next week.

## 2011-12-11 ENCOUNTER — Telehealth: Payer: Self-pay | Admitting: *Deleted

## 2011-12-11 NOTE — Telephone Encounter (Signed)
Patient called stating that her diarrhea has improved with the antibiotics and the probiotics that she is taking. She stated that she has received the containers form the facility to collect the stool specimen; however she would like Dr Rodena Medin to know that she would like to hold off on providing the specimen since there is an improvement in her diarrhea. She was advised that if diarrhea recurs after completing the antibiotic a stool specimen should be obtained. Patient has verbalized understanding and agrees to submit stool specimen if diarrhea recurs.

## 2011-12-11 NOTE — Telephone Encounter (Signed)
agree

## 2012-01-02 ENCOUNTER — Telehealth: Payer: Self-pay | Admitting: Internal Medicine

## 2012-01-02 DIAGNOSIS — K529 Noninfective gastroenteritis and colitis, unspecified: Secondary | ICD-10-CM

## 2012-01-02 NOTE — Telephone Encounter (Signed)
Per [facility], orders needed have been entered in EMR; spoke w/nurse at [facility] who states that patient was only given a specimen cup, [2] sticks & a top hat; LMOM at [facility] lab for call back to discuss stool studies needed and determine if they have the needed kits available at their location or if we need to get via our lab.

## 2012-01-02 NOTE — Telephone Encounter (Signed)
Please advise what stool tests pt should have?

## 2012-01-02 NOTE — Telephone Encounter (Signed)
Patient called in asking if it was okay for her son-in-law to drop off a stool sample later today.  See previous note

## 2012-01-02 NOTE — Telephone Encounter (Signed)
Stool kit received from our lab [unavailable at pt's residence], picked up at our office by pt's daughter, Kathrine Haddock [phone]] today at 4:45pm; explained instructions for stool kit usage in detail/SLS

## 2012-01-03 DIAGNOSIS — R197 Diarrhea, unspecified: Secondary | ICD-10-CM | POA: Diagnosis not present

## 2012-01-03 NOTE — Telephone Encounter (Signed)
Pt's daughter returned stool samples, order released and given to the lab.

## 2012-01-04 DIAGNOSIS — R197 Diarrhea, unspecified: Secondary | ICD-10-CM | POA: Diagnosis not present

## 2012-01-04 LAB — OVA AND PARASITE SCREEN: OP: NONE SEEN

## 2012-01-04 LAB — CLOSTRIDIUM DIFFICILE EIA: CDIFTX: NEGATIVE

## 2012-01-07 LAB — STOOL CULTURE

## 2012-01-19 DIAGNOSIS — B351 Tinea unguium: Secondary | ICD-10-CM | POA: Diagnosis not present

## 2012-01-19 DIAGNOSIS — M79609 Pain in unspecified limb: Secondary | ICD-10-CM | POA: Diagnosis not present

## 2012-01-23 ENCOUNTER — Ambulatory Visit: Payer: Medicare Other | Admitting: Internal Medicine

## 2012-01-26 ENCOUNTER — Ambulatory Visit: Payer: Medicare Other | Admitting: Internal Medicine

## 2012-01-26 ENCOUNTER — Telehealth: Payer: Self-pay | Admitting: Internal Medicine

## 2012-01-26 NOTE — Telephone Encounter (Signed)
Daughter wants to talk with Dr Rodena Medin about whether there is some slight depression on her mothers part and what to do about it.

## 2012-01-26 NOTE — Telephone Encounter (Signed)
Talked with pt's daughter. Concerned over some depressive sx's that occur regularly. Doesn't appear a danger to herself but may possible benefit from medication. Discussed possible low dose celexa vs wellbutrin. Daughter will broach the subject with pt. If willing to attempt then would recommend 4 wk f/u to reassess. Or make face to face appt to discuss further

## 2012-01-26 NOTE — Telephone Encounter (Signed)
Call placed to patients daughter [name] at [phone], she has requested to speak with Dr Rodena Medin regarding patients outlook on life. Patients daughter feels like there has been some emotional changes, and she would like to discuss options on how to improve patients emotional state. If there is a mild form of medication that could be taken.

## 2012-01-29 ENCOUNTER — Telehealth: Payer: Self-pay | Admitting: Internal Medicine

## 2012-01-29 NOTE — Telephone Encounter (Signed)
Notified pt's daughter per 01/26/12 phone note that a 4 wk f/u would be needed with Dr Rodena Medin. F/u scheduled for 03/06/12 at 11am as pt will be out of town prior to that date.

## 2012-01-29 NOTE — Telephone Encounter (Signed)
PATIENT SPOKE WITH DR Rodena Medin ON Friday ABOUT PATIENTS DEPRESSION.  DR HODGIN TOLD DAUGHTER TO CALL BACK IF SHE WANTED AN RX .  PLEASE SEND TO [facility]

## 2012-01-30 MED ORDER — CITALOPRAM HYDROBROMIDE 20 MG PO TABS: 20.0000 mg | ORAL_TABLET | Freq: Every day | ORAL | Status: DC

## 2012-01-30 NOTE — Telephone Encounter (Signed)
Addended by: Mervin Kung A on: 01/30/2012 03:23 PM   Modules accepted: Orders

## 2012-01-30 NOTE — Telephone Encounter (Signed)
Notified MaryBeth, pt has never tried medication in past. Rx sent to [facility] in [place] and pt will f/u in July.

## 2012-01-30 NOTE — Telephone Encounter (Signed)
Pt's daughter called wanting to know why pt needed appt before medication could be prescribed. Spoke to Provider and he states ok to call in Celexa 20mg  once daily and keep f/u in July.

## 2012-03-06 ENCOUNTER — Ambulatory Visit (INDEPENDENT_AMBULATORY_CARE_PROVIDER_SITE_OTHER): Payer: Medicare Other | Admitting: Internal Medicine

## 2012-03-06 ENCOUNTER — Encounter: Payer: Self-pay | Admitting: Internal Medicine

## 2012-03-06 VITALS — BP 110/70 | HR 57 | Wt 120.0 lb

## 2012-03-06 DIAGNOSIS — I10 Essential (primary) hypertension: Secondary | ICD-10-CM | POA: Diagnosis not present

## 2012-03-06 DIAGNOSIS — F329 Major depressive disorder, single episode, unspecified: Secondary | ICD-10-CM

## 2012-03-06 DIAGNOSIS — R197 Diarrhea, unspecified: Secondary | ICD-10-CM

## 2012-03-06 DIAGNOSIS — F32A Depression, unspecified: Secondary | ICD-10-CM | POA: Insufficient documentation

## 2012-03-06 DIAGNOSIS — K529 Noninfective gastroenteritis and colitis, unspecified: Secondary | ICD-10-CM | POA: Insufficient documentation

## 2012-03-06 LAB — BASIC METABOLIC PANEL
BUN: 18 mg/dL (ref 6–23)
Chloride: 100 mEq/L (ref 96–112)
Creat: 0.77 mg/dL (ref 0.50–1.10)
Glucose, Bld: 79 mg/dL (ref 70–99)
Potassium: 4.4 mEq/L (ref 3.5–5.3)

## 2012-03-06 NOTE — Progress Notes (Signed)
  Subjective:    Patient ID: Sheryl Lang, female    DOB: Mar 14, 1921, 78 y.o.   MRN: [id number]  HPI Pt presents to clinic for followup of multiple medical problems. Tolerating celexa for mild depression without adverse effect. Pt's daughter notices some mild improvement already. Notes once a week has episode of loose stools without overt diarrhea. Began in April after possible gastroenteritis. No improvement with probiotic. Stool studies neg. Unaware of triggers. Takes immodium prn. Wt up since last visit. No other alleviating or exacerbating factors.   Past Medical History  Diagnosis Date  . Osteoporosis   . Hypertension   . Transient memory loss 04/07/2011   No past surgical history on file.  reports that she has never smoked. She has never used smokeless tobacco. She reports that she drinks alcohol. Her drug history not on file. family history includes Diabetes in an unspecified family member. Allergies  Allergen Reactions  . Penicillins       Review of Systems see hpi     Objective:   Physical Exam  Nursing note and vitals reviewed. Constitutional: She appears well-developed and well-nourished. No distress.  HENT:  Head: Normocephalic and atraumatic.  Eyes: Conjunctivae are normal. No scleral icterus.  Neurological: She is alert.  Skin: She is not diaphoretic.  Psychiatric: She has a normal mood and affect.          Assessment & Plan:

## 2012-03-06 NOTE — Assessment & Plan Note (Signed)
Normotensive and stable. Continue current regimen. Monitor bp as outpt and followup in clinic as scheduled.  

## 2012-03-06 NOTE — Assessment & Plan Note (Signed)
Obtain zinc level and chem7. Advise food diary and dairy elimination for 7-10 days.

## 2012-03-06 NOTE — Assessment & Plan Note (Signed)
Improving. Continue celexa dosing.

## 2012-03-07 ENCOUNTER — Telehealth: Payer: Self-pay | Admitting: *Deleted

## 2012-03-07 NOTE — Telephone Encounter (Signed)
Yes. We mentioned that yesterday

## 2012-03-07 NOTE — Telephone Encounter (Signed)
Notified pts daughter

## 2012-03-07 NOTE — Telephone Encounter (Signed)
Received message from pt's daughter, [name] wanting to know if pt could also try Lactaid (lactose free)?

## 2012-03-22 ENCOUNTER — Other Ambulatory Visit: Payer: Self-pay | Admitting: Internal Medicine

## 2012-04-23 ENCOUNTER — Telehealth: Payer: Self-pay | Admitting: Internal Medicine

## 2012-04-23 MED ORDER — ATENOLOL 50 MG PO TABS: ORAL_TABLET | ORAL | Status: DC

## 2012-04-23 NOTE — Telephone Encounter (Signed)
Rx done/SLS 

## 2012-04-23 NOTE — Telephone Encounter (Signed)
Refill- atenolol 50mg  tablet. Take one tablet by mouth daily. Qty 30 last fill 7.26.13

## 2012-05-24 DIAGNOSIS — M79609 Pain in unspecified limb: Secondary | ICD-10-CM | POA: Diagnosis not present

## 2012-05-24 DIAGNOSIS — B351 Tinea unguium: Secondary | ICD-10-CM | POA: Diagnosis not present

## 2012-06-11 ENCOUNTER — Ambulatory Visit (INDEPENDENT_AMBULATORY_CARE_PROVIDER_SITE_OTHER): Payer: Medicare Other | Admitting: Internal Medicine

## 2012-06-11 ENCOUNTER — Encounter: Payer: Self-pay | Admitting: Internal Medicine

## 2012-06-11 VITALS — BP 92/68 | HR 57 | Temp 98.0°F | Resp 14 | Wt 124.0 lb

## 2012-06-11 DIAGNOSIS — I83893 Varicose veins of bilateral lower extremities with other complications: Secondary | ICD-10-CM | POA: Diagnosis not present

## 2012-06-11 DIAGNOSIS — H01004 Unspecified blepharitis left upper eyelid: Secondary | ICD-10-CM

## 2012-06-11 DIAGNOSIS — H01009 Unspecified blepharitis unspecified eye, unspecified eyelid: Secondary | ICD-10-CM

## 2012-06-11 MED ORDER — BACITRACIN-POLYMYXIN B 500-10000 UNIT/GM OP OINT: TOPICAL_OINTMENT | Freq: Two times a day (BID) | OPHTHALMIC | Status: DC

## 2012-06-11 NOTE — Patient Instructions (Signed)
You can reschedule your 10/29 appointment for 3-4 months

## 2012-06-15 DIAGNOSIS — H01004 Unspecified blepharitis left upper eyelid: Secondary | ICD-10-CM | POA: Insufficient documentation

## 2012-06-15 NOTE — Assessment & Plan Note (Signed)
Attempt ophthalmic topical antibiotic ointment. Followup if no improvement or worsening

## 2012-06-15 NOTE — Assessment & Plan Note (Signed)
Discussed compression hose and elevation of legs. Discussed possible vascular surgery consultation in the future if needed

## 2012-06-15 NOTE — Progress Notes (Signed)
  Subjective:    Patient ID: Sheryl Lang, female    DOB: 02/22/1921, 78 y.o.   MRN: [id number]  HPI patient presents to clinic for evaluation of eye irritation. Notes swelling of the left upper eyelid and crusting for the past approximately 3 days. Attempted Vaseline. There's been no ocular involvement and denies pain, redness or drainage. Does have history of blepharitis. Complains of intermittent right leg swelling which waxes and wanes. Does have significant venous varicosities involving that leg. It does ache periodically. No acute swelling and no associated shortness of breath. No other alleviating or exacerbating factors.  Past Medical History  Diagnosis Date  . Osteoporosis   . Hypertension   . Transient memory loss 04/07/2011   No past surgical history on file.  reports that she has never smoked. She has never used smokeless tobacco. She reports that she drinks alcohol. Her drug history not on file. family history includes Diabetes in an unspecified family member. Allergies  Allergen Reactions  . Penicillins     Review of Systems see history of present illness     Objective:   Physical Exam  Nursing note and vitals reviewed. Constitutional: She appears well-developed and well-nourished. No distress.  HENT:  Head: Normocephalic and atraumatic.  Right Ear: External ear normal.       Left upper eyelid-mild crusting as well as very mild soft tissue swelling. No vesicles or skin lesions  Eyes: Conjunctivae normal and EOM are normal. Pupils are equal, round, and reactive to light. Left eye exhibits no discharge. No scleral icterus.  Neurological: She is alert.  Skin: Skin is warm and dry. She is not diaphoretic.       Right lower extremity-significant venous varicosities. +1 bilateral lower extremity edema. No palpable cords, calf tenderness wounds or erythema  Psychiatric: She has a normal mood and affect.          Assessment & Plan:

## 2012-06-25 ENCOUNTER — Ambulatory Visit: Payer: Medicare Other | Admitting: Internal Medicine

## 2012-07-20 ENCOUNTER — Other Ambulatory Visit: Payer: Self-pay | Admitting: Internal Medicine

## 2012-07-22 NOTE — Telephone Encounter (Signed)
Rx to pharmacy/SLS 

## 2012-08-09 DIAGNOSIS — M79609 Pain in unspecified limb: Secondary | ICD-10-CM | POA: Diagnosis not present

## 2012-08-09 DIAGNOSIS — B351 Tinea unguium: Secondary | ICD-10-CM | POA: Diagnosis not present

## 2012-08-15 ENCOUNTER — Other Ambulatory Visit: Payer: Self-pay | Admitting: Internal Medicine

## 2012-09-11 ENCOUNTER — Other Ambulatory Visit: Payer: Self-pay | Admitting: Internal Medicine

## 2012-09-11 NOTE — Telephone Encounter (Signed)
Celexa request [Last Rx #30x0 12.19.13]/SLS Please advise.

## 2012-10-10 ENCOUNTER — Ambulatory Visit: Payer: Medicare Other | Admitting: Internal Medicine

## 2012-10-11 DIAGNOSIS — M79609 Pain in unspecified limb: Secondary | ICD-10-CM | POA: Diagnosis not present

## 2012-10-11 DIAGNOSIS — B351 Tinea unguium: Secondary | ICD-10-CM | POA: Diagnosis not present

## 2012-10-17 ENCOUNTER — Encounter: Payer: Self-pay | Admitting: Family Medicine

## 2012-10-17 ENCOUNTER — Ambulatory Visit (INDEPENDENT_AMBULATORY_CARE_PROVIDER_SITE_OTHER): Payer: Medicare Other | Admitting: Family Medicine

## 2012-10-17 VITALS — BP 100/60 | HR 57 | Temp 98.6°F | Resp 16 | Wt 128.0 lb

## 2012-10-17 DIAGNOSIS — M25569 Pain in unspecified knee: Secondary | ICD-10-CM | POA: Diagnosis not present

## 2012-10-17 DIAGNOSIS — I10 Essential (primary) hypertension: Secondary | ICD-10-CM

## 2012-10-17 DIAGNOSIS — F329 Major depressive disorder, single episode, unspecified: Secondary | ICD-10-CM

## 2012-10-17 DIAGNOSIS — M25561 Pain in right knee: Secondary | ICD-10-CM

## 2012-10-17 MED ORDER — ATENOLOL 50 MG PO TABS: 25.0000 mg | ORAL_TABLET | Freq: Every day | ORAL | Status: DC

## 2012-10-17 MED ORDER — CITALOPRAM HYDROBROMIDE 20 MG PO TABS: 20.0000 mg | ORAL_TABLET | Freq: Every day | ORAL | Status: DC

## 2012-10-20 ENCOUNTER — Encounter: Payer: Self-pay | Admitting: Family Medicine

## 2012-10-20 DIAGNOSIS — M25561 Pain in right knee: Secondary | ICD-10-CM

## 2012-10-20 HISTORY — DX: Pain in right knee: M25.561

## 2012-10-20 NOTE — Assessment & Plan Note (Signed)
Stable on Citalopram

## 2012-10-20 NOTE — Assessment & Plan Note (Signed)
Has had steroid injections and hyaluronic acid shots in past which were only minally effective. Encouraged Aspercreme and tylenol prn, conider referral to ortho if symptoms worsen.

## 2012-10-20 NOTE — Assessment & Plan Note (Signed)
Well controlled no changes today 

## 2012-10-20 NOTE — Progress Notes (Signed)
Patient ID: Sheryl Lang, female   DOB: 1921/07/02, 79 y.o.   MRN: [id number] SIOBHAN ZARO [id number] [birth year] 10/20/2012      Progress Note-Follow Up  Subjective  Chief Complaint  Chief Complaint  Patient presents with  . Hypertension    Pt reports recently elevated BP reading at home 160/85  . joint stiffness    Pt reports increasing right knee stiffness x 2 months.    HPI  Patient is a 79 yo female who is in today for followup. Overall she's doing relatively well but she is complaining of some pain in her right knee. In the past had cortisone and hyaluronic acid injections which were minimally helpful. At present there's been no injury or swelling just mildlly increased pain.  She notes a history of the left inguinal hernia that is asymptomatic. Her bowels are moving well. She denies any new urinary complaints. No chest pain, palpitations, fevers, chills, shortness of breath noted. Past Medical History  Diagnosis Date  . Osteoporosis   . Hypertension   . Transient memory loss 04/07/2011  . Right knee pain 10/20/2012    No past surgical history on file.  Family History  Problem Relation Age of Onset  . Diabetes      family hx    History   Social History  . Marital Status: Widowed    Spouse Name: N/A    Number of Children: N/A  . Years of Education: N/A   Occupational History  . Not on file.   Social History Main Topics  . Smoking status: Never Smoker   . Smokeless tobacco: Never Used  . Alcohol Use: Yes  . Drug Use: Not on file  . Sexually Active: Not on file   Other Topics Concern  . Not on file   Social History Narrative  . No narrative on file    Current Outpatient Prescriptions on File Prior to Visit  Medication Sig Dispense Refill  . acetaminophen (TYLENOL) 500 MG tablet 1 tab po twice daily for pain  30 tablet  0  . b complex vitamins tablet Take 1 tablet by mouth daily.        . diphenhydrAMINE (BENADRYL) 25 mg capsule Take 12.5 mg by mouth  at bedtime as needed.       Marland Kitchen ibuprofen (ADVIL,MOTRIN) 200 MG tablet 1 tab po twice daily with food prn pain, not controlled by tylenol  30 tablet  0  . Loperamide HCl (IMODIUM PO) Take by mouth as needed.      . Melatonin 500 MCG TBDP Take 500 mcg by mouth at bedtime as needed.      . vitamin C (ASCORBIC ACID) 500 MG tablet Take 500 mg by mouth daily.        Marland Kitchen VITAMIN D, CHOLECALCIFEROL, PO Take 1 tablet by mouth daily.        . bacitracin-polymyxin b (POLYSPORIN) ophthalmic ointment Place into the left eye every 12 (twelve) hours. apply to eyelid every 12 hours while awake (opthalmic ointment)  3.5 g  1   No current facility-administered medications on file prior to visit.    Allergies  Allergen Reactions  . Penicillins     Review of Systems  Review of Systems  Constitutional: Negative for fever and malaise/fatigue.  HENT: Negative for congestion.   Eyes: Negative for discharge.  Respiratory: Negative for shortness of breath.   Cardiovascular: Negative for chest pain, palpitations and leg swelling.  Gastrointestinal: Negative for nausea, abdominal pain and diarrhea.  Genitourinary: Negative for dysuria.  Musculoskeletal: Negative for falls.  Skin: Negative for rash.  Neurological: Negative for loss of consciousness and headaches.  Endo/Heme/Allergies: Negative for polydipsia.  Psychiatric/Behavioral: Negative for depression and suicidal ideas. The patient is not nervous/anxious and does not have insomnia.     Objective  BP 100/60  Pulse 57  Temp(Src) 98.6 F (37 C) (Oral)  Resp 16  Wt 128 lb (58.06 kg)  BMI 23.8 kg/m2  SpO2 97%  Physical Exam  Physical Exam  Constitutional: She is oriented to person, place, and time and well-developed, well-nourished, and in no distress. No distress.  HENT:  Head: Normocephalic and atraumatic.  Eyes: Conjunctivae are normal.  Neck: Neck supple. No thyromegaly present.  Cardiovascular: Normal rate, regular rhythm and normal heart  sounds.   No murmur heard. Pulmonary/Chest: Effort normal and breath sounds normal. She has no wheezes.  Abdominal: She exhibits no distension and no mass.  Musculoskeletal: She exhibits no edema.  Lymphadenopathy:    She has no cervical adenopathy.  Neurological: She is alert and oriented to person, place, and time.  Skin: Skin is warm and dry. No rash noted. She is not diaphoretic.  Psychiatric: Memory, affect and judgment normal.    No results found for this basename: TSH   Lab Results  Component Value Date   WBC 7.2 04/21/2011   HGB 13.4 04/21/2011   HCT 40.4 04/21/2011   MCV 86.5 04/21/2011   PLT 254 04/21/2011   Lab Results  Component Value Date   CREATININE 0.77 03/06/2012   BUN 18 03/06/2012   NA 137 03/06/2012   K 4.4 03/06/2012   CL 100 03/06/2012   CO2 29 03/06/2012   Lab Results  Component Value Date   ALT 35 04/21/2011   AST 37 04/21/2011   ALKPHOS 61 04/21/2011   BILITOT 0.7 04/21/2011    Assessment & Plan  HYPERTENSION Well controlled no changes today  Right knee pain Has had steroid injections and hyaluronic acid shots in past which were only minally effective. Encouraged Aspercreme and tylenol prn, conider referral to ortho if symptoms worsen.  Depression Stable on Citalopram.

## 2012-12-20 DIAGNOSIS — B351 Tinea unguium: Secondary | ICD-10-CM | POA: Diagnosis not present

## 2012-12-20 DIAGNOSIS — M79609 Pain in unspecified limb: Secondary | ICD-10-CM | POA: Diagnosis not present

## 2013-01-22 ENCOUNTER — Encounter: Payer: Self-pay | Admitting: Family Medicine

## 2013-01-22 ENCOUNTER — Ambulatory Visit (INDEPENDENT_AMBULATORY_CARE_PROVIDER_SITE_OTHER): Payer: Medicare Other | Admitting: Family Medicine

## 2013-01-22 VITALS — BP 130/72 | HR 56 | Temp 98.0°F | Ht 61.5 in | Wt 131.0 lb

## 2013-01-22 DIAGNOSIS — H01009 Unspecified blepharitis unspecified eye, unspecified eyelid: Secondary | ICD-10-CM

## 2013-01-22 DIAGNOSIS — I10 Essential (primary) hypertension: Secondary | ICD-10-CM

## 2013-01-22 DIAGNOSIS — M25569 Pain in unspecified knee: Secondary | ICD-10-CM

## 2013-01-22 DIAGNOSIS — M25561 Pain in right knee: Secondary | ICD-10-CM

## 2013-01-22 DIAGNOSIS — H01004 Unspecified blepharitis left upper eyelid: Secondary | ICD-10-CM

## 2013-01-22 NOTE — Patient Instructions (Addendum)
BRAT diet Benefiber twice a day A probiotic daily such as Digestive Advantage Keep a food diary  For the arm and leg pain try Naproxen 220 mg once in morning with food Add Tylenol 325 mg tabs (2) twice a day or 500mg  tabs just 1 twice a day Salon pas Call if interested in orthopaedic referral    Bursitis Bursitis is a swelling and soreness (inflammation) of a fluid-filled sac (bursa) that overlies and protects a joint. It can be caused by injury, overuse of the joint, arthritis or infection. The joints most likely to be affected are the elbows, shoulders, hips and knees. HOME CARE INSTRUCTIONS   Apply ice to the affected area for 15-20 minutes each hour while awake for 2 days. Put the ice in a plastic bag and place a towel between the bag of ice and your skin.  Rest the injured joint as much as possible, but continue to put the joint through a full range of motion, 4 times per day. (The shoulder joint especially becomes rapidly "frozen" if not used.) When the pain lessens, begin normal slow movements and usual activities.  Only take over-the-counter or prescription medicines for pain, discomfort or fever as directed by your caregiver.  Your caregiver may recommend draining the bursa and injecting medicine into the bursa. This may help the healing process.  Follow all instructions for follow-up with your caregiver. This includes any orthopedic referrals, physical therapy and rehabilitation. Any delay in obtaining necessary care could result in a delay or failure of the bursitis to heal and chronic pain. SEEK IMMEDIATE MEDICAL CARE IF:   Your pain increases even during treatment.  You develop an oral temperature above 102 F (38.9 C) and have heat and inflammation over the involved bursa. MAKE SURE YOU:   Understand these instructions.  Will watch your condition.  Will get help right away if you are not doing well or get worse. Document Released: 08/11/2000 Document Revised:  11/06/2011 Document Reviewed: 07/16/2009 [facility] Patient Information 2014 [place], [place].

## 2013-01-26 ENCOUNTER — Encounter: Payer: Self-pay | Admitting: Family Medicine

## 2013-01-26 NOTE — Assessment & Plan Note (Signed)
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

## 2013-01-26 NOTE — Assessment & Plan Note (Signed)
Well controlled no changes to meds 

## 2013-01-26 NOTE — Assessment & Plan Note (Addendum)
Now with increased right arm and right leg pain. No injury or swelling, encouraged Salon Pas topically Tylenol bid and only 1 Aleve daily describing the risks to use.

## 2013-01-26 NOTE — Progress Notes (Signed)
Patient ID: Sheryl Lang, female   DOB: 05/01/1921, 79 y.o.   MRN: [id number] Sheryl Lang [id number] 11-02-1920 01/26/2013      Progress Note-Follow Up  Subjective  Chief Complaint  Chief Complaint  Patient presents with  . Arm Pain    right X 1 month  . Leg Pain    right X  1 month    HPI  Patient is a 79 year old Caucasian female who is in today accompanied by her daughter. She is complaining of elbow and knee pain on the right. No falls or injury. No swelling or warmth. His persistent discomfort. Gets partial relief from Aleve. Is concerned about" and she, painful or changing. Continues to struggle with hernia in the left lower quadrant but is not having any discomfort with it. Bowels are moving several times a week. No chest pain, palpitations, fevers, GU complaints.  Past Medical History  Diagnosis Date  . Osteoporosis   . Hypertension   . Transient memory loss 04/07/2011  . Right knee pain 10/20/2012    History reviewed. No pertinent past surgical history.  Family History  Problem Relation Age of Onset  . Diabetes      family hx    History   Social History  . Marital Status: Widowed    Spouse Name: N/A    Number of Children: N/A  . Years of Education: N/A   Occupational History  . Not on file.   Social History Main Topics  . Smoking status: Never Smoker   . Smokeless tobacco: Never Used  . Alcohol Use: Yes  . Drug Use: Not on file  . Sexually Active: Not on file   Other Topics Concern  . Not on file   Social History Narrative  . No narrative on file    Current Outpatient Prescriptions on File Prior to Visit  Medication Sig Dispense Refill  . atenolol (TENORMIN) 50 MG tablet Take 0.5-1 tablets (25-50 mg total) by mouth daily.  90 tablet  1  . b complex vitamins tablet Take 1 tablet by mouth daily.        . citalopram (CELEXA) 20 MG tablet Take 1 tablet (20 mg total) by mouth daily.  30 tablet  5  . diphenhydrAMINE (BENADRYL) 25 mg capsule Take  12.5 mg by mouth at bedtime as needed.       Marland Kitchen ibuprofen (ADVIL,MOTRIN) 200 MG tablet 1 tab po twice daily with food prn pain, not controlled by tylenol  30 tablet  0  . Melatonin 500 MCG TBDP Take 500 mcg by mouth at bedtime as needed.      . vitamin C (ASCORBIC ACID) 500 MG tablet Take 500 mg by mouth daily.        Marland Kitchen VITAMIN D, CHOLECALCIFEROL, PO Take 1 tablet by mouth daily.        Marland Kitchen acetaminophen (TYLENOL) 500 MG tablet 1 tab po twice daily for pain  30 tablet  0  . Loperamide HCl (IMODIUM PO) Take by mouth as needed.       No current facility-administered medications on file prior to visit.    Allergies  Allergen Reactions  . Penicillins     Review of Systems  Review of Systems  Constitutional: Negative for fever and malaise/fatigue.  HENT: Negative for congestion.   Eyes: Negative for discharge.  Respiratory: Negative for shortness of breath.   Cardiovascular: Negative for chest pain, palpitations and leg swelling.  Gastrointestinal: Negative for nausea, abdominal pain and diarrhea.  Genitourinary: Negative for dysuria.  Musculoskeletal: Positive for joint pain. Negative for falls.       Right elbow and right knee pain  Skin: Negative for rash.  Neurological: Negative for loss of consciousness and headaches.  Endo/Heme/Allergies: Negative for polydipsia.  Psychiatric/Behavioral: Negative for depression and suicidal ideas. The patient is not nervous/anxious and does not have insomnia.     Objective  BP 130/72  Pulse 56  Temp(Src) 98 F (36.7 C) (Oral)  Ht 5' 1.5" (1.562 m)  Wt 131 lb 0.6 oz (59.439 kg)  BMI 24.36 kg/m2  SpO2 96%  Physical Exam  Physical Exam  Constitutional: She is oriented to person, place, and time and well-developed, well-nourished, and in no distress. No distress.  HENT:  Head: Normocephalic and atraumatic.  Right Ear: External ear normal.  Left Ear: External ear normal.  Nose: Nose normal.  Mouth/Throat: Oropharynx is clear and moist.  No oropharyngeal exudate.  Eyes: Conjunctivae are normal. Pupils are equal, round, and reactive to light. Right eye exhibits no discharge. Left eye exhibits no discharge. No scleral icterus.  Neck: Normal range of motion. Neck supple. No thyromegaly present.  Cardiovascular: Normal rate, regular rhythm, normal heart sounds and intact distal pulses.   No murmur heard. Pulmonary/Chest: Effort normal and breath sounds normal. No respiratory distress. She has no wheezes. She has no rales.  Abdominal: Soft. Bowel sounds are normal. She exhibits no distension and no mass. There is no tenderness.  Musculoskeletal: Normal range of motion. She exhibits no edema and no tenderness.  Lymphadenopathy:    She has no cervical adenopathy.  Neurological: She is alert and oriented to person, place, and time. She has normal reflexes. No cranial nerve deficit. Coordination normal.  Skin: Skin is warm and dry. No rash noted. She is not diaphoretic.  Psychiatric: Mood, memory and affect normal.    No results found for this basename: TSH   Lab Results  Component Value Date   WBC 7.2 04/21/2011   HGB 13.4 04/21/2011   HCT 40.4 04/21/2011   MCV 86.5 04/21/2011   PLT 254 04/21/2011   Lab Results  Component Value Date   CREATININE 0.77 03/06/2012   BUN 18 03/06/2012   NA 137 03/06/2012   K 4.4 03/06/2012   CL 100 03/06/2012   CO2 29 03/06/2012   Lab Results  Component Value Date   ALT 35 04/21/2011   AST 37 04/21/2011   ALKPHOS 61 04/21/2011   BILITOT 0.7 04/21/2011     Assessment & Plan  Right knee pain Now with increased right arm and right leg pain. No injury or swelling, encouraged Salon Pas topically Tylenol bid and only 1 Aleve daily describing the risks to use.   HYPERTENSION Well controlled no changes to meds  Blepharitis of left upper eyelid Cholesterol deposits on both eyelids, no concerns on exam.

## 2013-01-28 ENCOUNTER — Telehealth: Payer: Self-pay

## 2013-01-28 NOTE — Telephone Encounter (Signed)
So we can try it but as people get older we can have increased side effects, ie confusion, sedation. If they want to try Gabapentin just 100 mg po qhs for a month they can but they should monitor closely and then come back in for a visit if they want to titrate up the strength of the medication

## 2013-01-28 NOTE — Telephone Encounter (Signed)
pts daughter left a message stating that MD diagnosed her mom with bursitis in her arms and legs. Pt stated her sister has bursitis also and takes Gabapentin 100 mg at night and is wandering if MD will prescribe this for pt? Please advise?

## 2013-01-29 NOTE — Telephone Encounter (Signed)
pts daughter informed and would like to know if there is anything else that doesn't cause these side effects?  Pts daughter also stated that her mom takes an exercise class where they might lift 1 pound but she has been complaining of her arm hurting? Pts daughter would like to know if pt should not do the exercises that hurt her arm or should pt do them to help her?  Please advise both questions

## 2013-01-29 NOTE — Telephone Encounter (Signed)
She should avoid the exercises that bother her for the next month and see if that helps. Unfortunately most pain meds can potentially have these same side effects. The only options that might not are the Tylenol and Salon Pas topically

## 2013-01-30 NOTE — Telephone Encounter (Signed)
pts daughter informed and voiced understanding 

## 2013-02-04 ENCOUNTER — Telehealth: Payer: Self-pay

## 2013-02-04 NOTE — Telephone Encounter (Signed)
pts daughter [name]) left a message stating she would like to know what the brand of probiotic was that MD suggested for her mother?   I left a message on MaryBeths vm that the AVS stated to take Digestive Advantage

## 2013-02-21 DIAGNOSIS — M79609 Pain in unspecified limb: Secondary | ICD-10-CM | POA: Diagnosis not present

## 2013-02-21 DIAGNOSIS — B351 Tinea unguium: Secondary | ICD-10-CM | POA: Diagnosis not present

## 2013-03-06 DIAGNOSIS — M25519 Pain in unspecified shoulder: Secondary | ICD-10-CM | POA: Diagnosis not present

## 2013-04-21 ENCOUNTER — Telehealth: Payer: Self-pay

## 2013-04-21 DIAGNOSIS — R410 Disorientation, unspecified: Secondary | ICD-10-CM

## 2013-04-21 NOTE — Telephone Encounter (Signed)
So the Citalopram can cause vivid dreams and switching to different med may help but she needs to know that it is also possible the new med could contribute to this as well or might not. Only trial and error can tell us. Also UTI can cause this so would check a UA with c&s if they would let us. If this is negative woul switch her to Sertraline 25 mg po daily disp #30 with 2 refills and have her see me in roughly 6-8 weeks

## 2013-04-21 NOTE — Telephone Encounter (Signed)
Patients daughter called stating that pt is on a mild antideppresant but has been having vivid dreams that are bothering her? Pts daughter stated on the message that Dr Rodena Medin had said before that the medication could cause this? pts daughter is wanting Dr Mariel Aloe opinion on this and if this is true is there any similar anti-deppresant that pt can switch to that won't give her the vivid dreams?   Please advise?

## 2013-04-22 NOTE — Telephone Encounter (Signed)
pts daughter informed and is going to bring patient in for a U/A with C&S.  Order placed

## 2013-04-24 DIAGNOSIS — F29 Unspecified psychosis not due to a substance or known physiological condition: Secondary | ICD-10-CM | POA: Diagnosis not present

## 2013-04-25 DIAGNOSIS — B351 Tinea unguium: Secondary | ICD-10-CM | POA: Diagnosis not present

## 2013-04-25 DIAGNOSIS — M79609 Pain in unspecified limb: Secondary | ICD-10-CM | POA: Diagnosis not present

## 2013-04-30 MED ORDER — CEFDINIR 300 MG PO CAPS: 300.0000 mg | ORAL_CAPSULE | Freq: Two times a day (BID) | ORAL | Status: DC

## 2013-04-30 NOTE — Telephone Encounter (Signed)
Quick Note:  Patient Informed and voiced understanding.  RX sent ______ 

## 2013-04-30 NOTE — Addendum Note (Signed)
Addended by: Court Joy on: 04/30/2013 04:56 PM   Modules accepted: Orders

## 2013-05-01 ENCOUNTER — Other Ambulatory Visit: Payer: Self-pay | Admitting: Family Medicine

## 2013-05-19 ENCOUNTER — Encounter: Payer: Self-pay | Admitting: Family Medicine

## 2013-05-19 ENCOUNTER — Ambulatory Visit (INDEPENDENT_AMBULATORY_CARE_PROVIDER_SITE_OTHER): Payer: Medicare Other | Admitting: Family Medicine

## 2013-05-19 VITALS — BP 110/66 | HR 77 | Temp 98.2°F | Ht 61.5 in | Wt 133.0 lb

## 2013-05-19 DIAGNOSIS — N390 Urinary tract infection, site not specified: Secondary | ICD-10-CM | POA: Diagnosis not present

## 2013-05-19 DIAGNOSIS — I10 Essential (primary) hypertension: Secondary | ICD-10-CM | POA: Diagnosis not present

## 2013-05-19 DIAGNOSIS — L219 Seborrheic dermatitis, unspecified: Secondary | ICD-10-CM

## 2013-05-19 DIAGNOSIS — Z23 Encounter for immunization: Secondary | ICD-10-CM

## 2013-05-19 DIAGNOSIS — L210 Seborrhea capitis: Secondary | ICD-10-CM

## 2013-05-19 HISTORY — DX: Urinary tract infection, site not specified: N39.0

## 2013-05-19 HISTORY — DX: Seborrheic dermatitis, unspecified: L21.9

## 2013-05-19 MED ORDER — TRIAMCINOLONE ACETONIDE 0.1 % EX CREA: TOPICAL_CREAM | Freq: Two times a day (BID) | CUTANEOUS | Status: DC | PRN

## 2013-05-19 NOTE — Assessment & Plan Note (Signed)
Recent Ecoli well treated with antibiotics, patient feeling well today

## 2013-05-19 NOTE — Assessment & Plan Note (Signed)
Well controlled no changes 

## 2013-05-19 NOTE — Patient Instructions (Addendum)
Neutragena T gel shampoo 3 x a week to start then as needed   Seborrheic Dermatitis Seborrheic dermatitis involves pink or red skin with greasy, flaky scales. This is often found on the scalp, eyebrows, nose, bearded area, and on or behind the ears. It can also occur on the central chest. It often occurs where there are more oil (sebaceous) glands. This condition is also known as dandruff. When this condition affects a baby's scalp, it is called cradle cap. It may come and go for no known reason. It can occur at any time of life from infancy to old age. CAUSES  The cause is unknown. It is not the result of too little moisture or too much oil. In some people, seborrheic dermatitis flare-ups seem to be triggered by stress. It also commonly occurs in people with certain diseases such as Parkinson's disease or HIV/AIDS. SYMPTOMS   Thick scales on the scalp.  Redness on the face or in the armpits.  The skin may seem oily or dry, but moisturizers do not help.  In infants, seborrheic dermatitis appears as scaly redness that does not seem to bother the baby. In some babies, it affects only the scalp. In others, it also affects the neck creases, armpits, groin, or behind the ears.  In adults and adolescents, seborrheic dermatitis may affect only the scalp. It may look patchy or spread out, with areas of redness and flaking. Other areas commonly affected include:  Eyebrows.  Eyelids.  Forehead.  Skin behind the ears.  Outer ears.  Chest.  Armpits.  Nose creases.  Skin creases under the breasts.  Skin between the buttocks.  Groin.  Some adults and adolescents feel itching or burning in the affected areas. DIAGNOSIS  Your caregiver can usually tell what the problem is by doing a physical exam. TREATMENT   Cortisone (steroid) ointments, creams, and lotions can help decrease inflammation.  Babies can be treated with baby oil to soften the scales, then they may be washed with baby  shampoo. If this does not help, a prescription topical steroid medicine may work.  Adults can use medicated shampoos.  Your caregiver may prescribe corticosteroid cream and shampoo containing an antifungal or yeast medicine (ketoconazole). Hydrocortisone or anti-yeast cream can be rubbed directly onto seborrheic dermatitis patches. Yeast does not cause seborrheic dermatitis, but it seems to add to the problem. In infants, seborrheic dermatitis is often worst during the first year of life. It tends to disappear on its own as the child grows. However, it may return during the teenage years. In adults and adolescents, seborrheic dermatitis tends to be a long-lasting condition that comes and goes over many years. HOME CARE INSTRUCTIONS   Use prescribed medicines as directed.  In infants, do not aggressively remove the scales or flakes on the scalp with a comb or by other means. This may lead to hair loss. SEEK MEDICAL CARE IF:   The problem does not improve from the medicated shampoos, lotions, or other medicines given by your caregiver.  You have any other questions or concerns. Document Released: 08/14/2005 Document Revised: 02/13/2012 Document Reviewed: 01/03/2010 [facility] Patient Information 2014 [place], [place].

## 2013-05-19 NOTE — Progress Notes (Signed)
Patient ID: Sheryl Lang, female   DOB: [birth year], 79 y.o.   MRN: [id number] Sheryl Lang [id number] Nov 27, 1920 05/19/2013      Progress Note-Follow Up  Subjective  Chief Complaint  Chief Complaint  Patient presents with  . Rash    on neck  . Injections    flu    HPI  Patient is a 79 year old Caucasian female who is here today accompanied by her daughter. She is having mildly pruritic rash along the left side of her neck and scalp. Her daughter thinks it's been present a short time but she says it's been present off-and-on for over 6 months. She denies any pain or blisters. It is not generalized to be on her scalp and left side of her neck. Otherwise she reports having well. Has not tried any topical treatments except for some antibiotic ointment. No fevers and chills. No palpitations, chest pain or recent illness. No GI or GU complaints. She began feeling better after her recent UTI treatment.  Past Medical History  Diagnosis Date  . Osteoporosis   . Hypertension   . Transient memory loss 04/07/2011  . Right knee pain 10/20/2012  . Dermatitis, seborrheic 05/19/2013  . UTI (urinary tract infection) 05/19/2013    History reviewed. No pertinent past surgical history.  Family History  Problem Relation Age of Onset  . Diabetes      family hx    History   Social History  . Marital Status: Widowed    Spouse Name: N/A    Number of Children: N/A  . Years of Education: N/A   Occupational History  . Not on file.   Social History Main Topics  . Smoking status: Never Smoker   . Smokeless tobacco: Never Used  . Alcohol Use: Yes  . Drug Use: Not on file  . Sexual Activity: Not on file   Other Topics Concern  . Not on file   Social History Narrative  . No narrative on file    Current Outpatient Prescriptions on File Prior to Visit  Medication Sig Dispense Refill  . acetaminophen (TYLENOL) 500 MG tablet 1 tab po twice daily for pain  30 tablet  0  . atenolol  (TENORMIN) 50 MG tablet TAKE HALF TO ONE TABLET BY MOUTH DAILY  90 tablet  0  . b complex vitamins tablet Take 1 tablet by mouth daily.        . citalopram (CELEXA) 20 MG tablet Take 1 tablet (20 mg total) by mouth daily.  30 tablet  5  . diphenhydrAMINE (BENADRYL) 25 mg capsule Take 12.5 mg by mouth at bedtime as needed.       Marland Kitchen ibuprofen (ADVIL,MOTRIN) 200 MG tablet 1 tab po twice daily with food prn pain, not controlled by tylenol  30 tablet  0  . KRILL OIL PO Take by mouth.      . Loperamide HCl (IMODIUM PO) Take by mouth as needed.      . simethicone (MYLICON) 125 MG chewable tablet Chew 125 mg by mouth every 6 (six) hours as needed for flatulence.      . vitamin C (ASCORBIC ACID) 500 MG tablet Take 500 mg by mouth daily.        Marland Kitchen VITAMIN D, CHOLECALCIFEROL, PO Take 1 tablet by mouth daily.         No current facility-administered medications on file prior to visit.    Allergies  Allergen Reactions  . Penicillins     Review of  Systems  Review of Systems  Constitutional: Negative for fever and malaise/fatigue.  HENT: Negative for congestion.   Eyes: Negative for discharge.  Respiratory: Negative for shortness of breath.   Cardiovascular: Negative for chest pain, palpitations and leg swelling.  Gastrointestinal: Negative for nausea, abdominal pain and diarrhea.  Genitourinary: Negative for dysuria.  Musculoskeletal: Negative for falls.  Skin: Positive for itching and rash.  Neurological: Negative for loss of consciousness and headaches.  Endo/Heme/Allergies: Negative for polydipsia.  Psychiatric/Behavioral: Negative for depression and suicidal ideas. The patient is not nervous/anxious and does not have insomnia.     Objective  BP 110/66  Pulse 77  Temp(Src) 98.2 F (36.8 C) (Oral)  Ht 5' 1.5" (1.562 m)  Wt 133 lb 0.6 oz (60.347 kg)  BMI 24.73 kg/m2  SpO2 97%  Physical Exam  Physical Exam  Constitutional: She is oriented to person, place, and time and  well-developed, well-nourished, and in no distress. No distress.  HENT:  Head: Normocephalic and atraumatic.  Eyes: Conjunctivae are normal.  Neck: Neck supple. No thyromegaly present.  Cardiovascular: Normal rate, regular rhythm and normal heart sounds.   Pulmonary/Chest: Effort normal and breath sounds normal. She has no wheezes.  Abdominal: She exhibits no distension and no mass.  Musculoskeletal: She exhibits no edema.  Lymphadenopathy:    She has no cervical adenopathy.  Neurological: She is alert and oriented to person, place, and time.  Skin: Skin is warm and dry. Rash noted. She is not diaphoretic. There is erythema.  Scaly, erythematous patches base of scalp and on base of left side of neck.   Psychiatric: Memory, affect and judgment normal.    No results found for this basename: TSH   Lab Results  Component Value Date   WBC 7.2 04/21/2011   HGB 13.4 04/21/2011   HCT 40.4 04/21/2011   MCV 86.5 04/21/2011   PLT 254 04/21/2011   Lab Results  Component Value Date   CREATININE 0.77 03/06/2012   BUN 18 03/06/2012   NA 137 03/06/2012   K 4.4 03/06/2012   CL 100 03/06/2012   CO2 29 03/06/2012   Lab Results  Component Value Date   ALT 35 04/21/2011   AST 37 04/21/2011   ALKPHOS 61 04/21/2011   BILITOT 0.7 04/21/2011     Assessment & Plan  HYPERTENSION Well controlled no changes   Dermatitis, seborrheic Scalp and neck, start with Neutragena T gel shampoo 3 x a week then use Triamcinolone cream to use bid as needed. Referred to dermatology for further consideration. She has numerous very dark SKs she is also worried about so we will have this looked at too.  UTI (urinary tract infection) Recent Ecoli well treated with antibiotics, patient feeling well today

## 2013-05-19 NOTE — Assessment & Plan Note (Signed)
Scalp and neck, start with Neutragena T gel shampoo 3 x a week then use Triamcinolone cream to use bid as needed. Referred to dermatology for further consideration. She has numerous very dark SKs she is also worried about so we will have this looked at too.

## 2013-06-03 ENCOUNTER — Other Ambulatory Visit: Payer: Self-pay | Admitting: Family Medicine

## 2013-06-24 ENCOUNTER — Other Ambulatory Visit: Payer: Self-pay

## 2013-06-24 MED ORDER — ATENOLOL 50 MG PO TABS: 50.0000 mg | ORAL_TABLET | Freq: Every day | ORAL | Status: DC

## 2013-06-27 DIAGNOSIS — M79609 Pain in unspecified limb: Secondary | ICD-10-CM | POA: Diagnosis not present

## 2013-06-27 DIAGNOSIS — B351 Tinea unguium: Secondary | ICD-10-CM | POA: Diagnosis not present

## 2013-06-30 DIAGNOSIS — L821 Other seborrheic keratosis: Secondary | ICD-10-CM | POA: Diagnosis not present

## 2013-06-30 DIAGNOSIS — D485 Neoplasm of uncertain behavior of skin: Secondary | ICD-10-CM | POA: Diagnosis not present

## 2013-06-30 DIAGNOSIS — L219 Seborrheic dermatitis, unspecified: Secondary | ICD-10-CM | POA: Diagnosis not present

## 2013-07-22 ENCOUNTER — Other Ambulatory Visit: Payer: Self-pay | Admitting: Family Medicine

## 2013-07-29 ENCOUNTER — Other Ambulatory Visit: Payer: Self-pay | Admitting: Family Medicine

## 2013-09-02 ENCOUNTER — Telehealth: Payer: Self-pay | Admitting: Family Medicine

## 2013-09-02 NOTE — Telephone Encounter (Signed)
pts daughter informed that I went back into medical history and could see 4-12 and there was never any Fosamax on it.

## 2013-09-02 NOTE — Telephone Encounter (Signed)
Patient is to have a dental procedure.  The dentist wants to know the last time the patient was on Fosamax.  Daughter thinks she was never on it but needs to know for sure

## 2013-09-03 ENCOUNTER — Telehealth: Payer: Self-pay | Admitting: Family Medicine

## 2013-09-03 NOTE — Telephone Encounter (Signed)
THE DENTIST WOULD LIKE A NOTE FROM DR B SAYING IT IS ALL RIGHT FOR HER TO HAVE HER TOOTH EXTRACTED.  THE LAST TIME ANYONE PRESCRIBED FOSAMAX THAT THE DAUGHTER CAN FIND WAS IN 2009 AND SHE IS NOT SURE IF THAT WAS EVER EVEN FILLED.

## 2013-09-03 NOTE — Telephone Encounter (Signed)
Ok to give verbal order stating patient does not have any meds or medical condition that would preclude a dental extraction as long as patient is feeling well

## 2013-09-05 DIAGNOSIS — M79609 Pain in unspecified limb: Secondary | ICD-10-CM | POA: Diagnosis not present

## 2013-09-05 DIAGNOSIS — B351 Tinea unguium: Secondary | ICD-10-CM | POA: Diagnosis not present

## 2013-09-05 NOTE — Telephone Encounter (Signed)
Notified pt's daughter. She requests that the letter be faxed to the dentist but will have to call back with fax #.

## 2013-09-05 NOTE — Telephone Encounter (Signed)
Dr. Jolayne Panther fax [phone]

## 2013-09-05 NOTE — Telephone Encounter (Signed)
Letter completed and faxed to number below.

## 2013-09-18 ENCOUNTER — Other Ambulatory Visit: Payer: Self-pay | Admitting: Family Medicine

## 2013-10-08 DIAGNOSIS — H353290 Exudative age-related macular degeneration, unspecified eye, stage unspecified: Secondary | ICD-10-CM | POA: Diagnosis not present

## 2013-10-08 DIAGNOSIS — H2510 Age-related nuclear cataract, unspecified eye: Secondary | ICD-10-CM | POA: Diagnosis not present

## 2013-10-08 DIAGNOSIS — H40029 Open angle with borderline findings, high risk, unspecified eye: Secondary | ICD-10-CM | POA: Diagnosis not present

## 2013-11-03 ENCOUNTER — Encounter: Payer: Self-pay | Admitting: Family Medicine

## 2013-11-03 ENCOUNTER — Ambulatory Visit (INDEPENDENT_AMBULATORY_CARE_PROVIDER_SITE_OTHER): Payer: Medicare Other | Admitting: Family Medicine

## 2013-11-03 VITALS — BP 140/70 | HR 67 | Temp 98.0°F | Ht 61.5 in | Wt 135.1 lb

## 2013-11-03 DIAGNOSIS — K529 Noninfective gastroenteritis and colitis, unspecified: Secondary | ICD-10-CM

## 2013-11-03 DIAGNOSIS — N390 Urinary tract infection, site not specified: Secondary | ICD-10-CM | POA: Diagnosis not present

## 2013-11-03 DIAGNOSIS — I10 Essential (primary) hypertension: Secondary | ICD-10-CM | POA: Diagnosis not present

## 2013-11-03 DIAGNOSIS — R197 Diarrhea, unspecified: Secondary | ICD-10-CM | POA: Diagnosis not present

## 2013-11-03 NOTE — Progress Notes (Signed)
Patient ID: Sheryl Lang, female   DOB: [birth year], 80 y.o.   MRN: [id number] MARIGENE ERLER [id number] 1920-09-28 11/03/2013      Progress Note-Follow Up  Subjective  Chief Complaint  Chief Complaint  Patient presents with  . Follow-up    6-8 week    HPI   patient is a 80 year old Caucasian female who is in today with her daughter for followup. Overall she's doing well. she does have some episodes of dreams and confusion but overall these are minor. She's had no recent illness. She continues to struggle with some occasional loose stool but never any bloody or tarry stool. He reports he is good a good appetite. She has some trouble with loose stool intermittently but this is somewhat improved from previous. He denied chest pain, palpitations or shortness of breath. No fevers or chills.  Past Medical History  Diagnosis Date  . Osteoporosis   . Hypertension   . Transient memory loss 04/07/2011  . Right knee pain 10/20/2012  . Dermatitis, seborrheic 05/19/2013  . UTI (urinary tract infection) 05/19/2013    No past surgical history on file.  Family History  Problem Relation Age of Onset  . Diabetes      family hx    History   Social History  . Marital Status: Widowed    Spouse Name: N/A    Number of Children: N/A  . Years of Education: N/A   Occupational History  . Not on file.   Social History Main Topics  . Smoking status: Never Smoker   . Smokeless tobacco: Never Used  . Alcohol Use: Yes  . Drug Use: Not on file  . Sexual Activity: Not on file   Other Topics Concern  . Not on file   Social History Narrative  . No narrative on file    Current Outpatient Prescriptions on File Prior to Visit  Medication Sig Dispense Refill  . acetaminophen (TYLENOL) 500 MG tablet 1 tab po twice daily for pain  30 tablet  0  . atenolol (TENORMIN) 50 MG tablet Take 1 tablet (50 mg total) by mouth daily.  90 tablet  1  . b complex vitamins tablet Take 1 tablet by mouth daily.         . citalopram (CELEXA) 20 MG tablet TAKE 1 TABLET BY MOUTH DAILY  30 tablet  0  . diphenhydrAMINE (BENADRYL) 25 mg capsule Take 12.5 mg by mouth at bedtime as needed.       Marland Kitchen ibuprofen (ADVIL,MOTRIN) 200 MG tablet 1 tab po twice daily with food prn pain, not controlled by tylenol  30 tablet  0  . KRILL OIL PO Take by mouth.      . Loperamide HCl (IMODIUM PO) Take by mouth as needed.      . simethicone (MYLICON) 782 MG chewable tablet Chew 125 mg by mouth every 6 (six) hours as needed for flatulence.      . triamcinolone cream (KENALOG) 0.1 % Apply topically 2 (two) times daily as needed. Rash on neck  85.2 g  1  . vitamin C (ASCORBIC ACID) 500 MG tablet Take 500 mg by mouth daily.        Marland Kitchen VITAMIN D, CHOLECALCIFEROL, PO Take 1 tablet by mouth daily.         No current facility-administered medications on file prior to visit.    Allergies  Allergen Reactions  . Penicillins     Review of Systems  Review of Systems  Constitutional:  Negative for fever and malaise/fatigue.  HENT: Negative for congestion.   Eyes: Negative for discharge.  Respiratory: Negative for shortness of breath.   Cardiovascular: Negative for chest pain, palpitations and leg swelling.  Gastrointestinal: Positive for diarrhea. Negative for nausea and abdominal pain.  Genitourinary: Positive for frequency. Negative for dysuria.  Musculoskeletal: Negative for falls.  Skin: Negative for rash.  Neurological: Negative for loss of consciousness and headaches.  Endo/Heme/Allergies: Negative for polydipsia.  Psychiatric/Behavioral: Negative for depression and suicidal ideas. The patient is not nervous/anxious and does not have insomnia.     Objective  BP 140/70  Pulse 67  Temp(Src) 98 F (36.7 C)  Ht 5' 1.5" (1.562 m)  Wt 135 lb 1.9 oz (61.29 kg)  BMI 25.12 kg/m2  SpO2 94%  Physical Exam  Physical Exam  Constitutional: She is oriented to person, place, and time and well-developed, well-nourished, and in no  distress. No distress.  HENT:  Head: Normocephalic and atraumatic.  Eyes: Conjunctivae are normal.  Neck: Neck supple. No thyromegaly present.  Cardiovascular: Normal rate, regular rhythm and normal heart sounds.   No murmur heard. Pulmonary/Chest: Effort normal and breath sounds normal. She has no wheezes.  Abdominal: She exhibits no distension and no mass.  Musculoskeletal: She exhibits no edema.  Lymphadenopathy:    She has no cervical adenopathy.  Neurological: She is alert and oriented to person, place, and time.  Skin: Skin is warm and dry. No rash noted. She is not diaphoretic.  Psychiatric: Memory, affect and judgment normal.    No results found for this basename: TSH   Lab Results  Component Value Date   WBC 7.2 04/21/2011   HGB 13.4 04/21/2011   HCT 40.4 04/21/2011   MCV 86.5 04/21/2011   PLT 254 04/21/2011   Lab Results  Component Value Date   CREATININE 0.77 03/06/2012   BUN 18 03/06/2012   NA 137 03/06/2012   K 4.4 03/06/2012   CL 100 03/06/2012   CO2 29 03/06/2012   Lab Results  Component Value Date   ALT 35 04/21/2011   AST 37 04/21/2011   ALKPHOS 61 04/21/2011   BILITOT 0.7 04/21/2011    Assessment & Plan  HYPERTENSION Well controlled no changes  UTI (urinary tract infection) Check urinalysis, and culture maintain adequate hydration  Hypercalcemia Repeat renal panel at next visit.   Chronic diarrhea Improved with probiotics

## 2013-11-03 NOTE — Assessment & Plan Note (Signed)
Improved with probiotics. 

## 2013-11-03 NOTE — Progress Notes (Signed)
Pre visit review using our clinic review tool, if applicable. No additional management support is needed unless otherwise documented below in the visit note. 

## 2013-11-03 NOTE — Assessment & Plan Note (Signed)
Repeat renal panel at next visit.

## 2013-11-03 NOTE — Assessment & Plan Note (Signed)
Well controlled no changes 

## 2013-11-03 NOTE — Patient Instructions (Signed)
Osteoarthritis Osteoarthritis is a disease that causes soreness and swelling (inflammation) of a joint. It occurs when the cartilage at the affected joint wears down. Cartilage acts as a cushion, covering the ends of bones where they meet to form a joint. Osteoarthritis is the most common form of arthritis. It often occurs in older people. The joints affected most often by this condition include those in the:  Ends of the fingers.  Thumbs.  Neck.  Lower back.  Knees.  Hips. CAUSES  Over time, the cartilage that covers the ends of bones begins to wear away. This causes bone to rub on bone, producing pain and stiffness in the affected joints.  RISK FACTORS Certain factors can increase your chances of having osteoarthritis, including:  Older age.  Excessive body weight.  Overuse of joints. SIGNS AND SYMPTOMS   Pain, swelling, and stiffness in the joint.  Over time, the joint may lose its normal shape.  Small deposits of bone (osteophytes) may grow on the edges of the joint.  Bits of bone or cartilage can break off and float inside the joint space. This may cause more pain and damage. DIAGNOSIS  Your health care provider will do a physical exam and ask about your symptoms. Various tests may be ordered, such as:  X-rays of the affected joint.  An MRI scan.  Blood tests to rule out other types of arthritis.  Joint fluid tests. This involves using a needle to draw fluid from the joint and examining the fluid under a microscope. TREATMENT  Goals of treatment are to control pain and improve joint function. Treatment plans may include:  A prescribed exercise program that allows for rest and joint relief.  A weight control plan.  Pain relief techniques, such as:  Properly applied heat and cold.  Electric pulses delivered to nerve endings under the skin (transcutaneous electrical nerve stimulation, TENS).  Massage.  Certain nutritional supplements.  Medicines to  control pain, such as:  Acetaminophen.  Nonsteroidal anti-inflammatory drugs (NSAIDs), such as naproxen.  Narcotic or central-acting agents, such as tramadol.  Corticosteroids. These can be given orally or as an injection.  Surgery to reposition the bones and relieve pain (osteotomy) or to remove loose pieces of bone and cartilage. Joint replacement may be needed in advanced states of osteoarthritis. HOME CARE INSTRUCTIONS   Only take over-the-counter or prescription medicines as directed by your health care provider. Take all medicines exactly as instructed.  Maintain a healthy weight. Follow your health care provider's instructions for weight control. This may include dietary instructions.  Exercise as directed. Your health care provider can recommend specific types of exercise. These may include:  Strengthening exercises These are done to strengthen the muscles that support joints affected by arthritis. They can be performed with weights or with exercise bands to add resistance.  Aerobic activities These are exercises, such as brisk walking or low-impact aerobics, that get your heart pumping.  Range-of-motion activities These keep your joints limber.  Balance and agility exercises These help you maintain daily living skills.  Rest your affected joints as directed by your health care provider.  Follow up with your health care provider as directed. SEEK MEDICAL CARE IF:   Your skin turns red.  You develop a rash in addition to your joint pain.  You have worsening joint pain. SEEK IMMEDIATE MEDICAL CARE IF:  You have a significant loss of weight or appetite.  You have a fever along with joint or muscle aches.  You have   night sweats. FOR MORE INFORMATION  National Institute of Arthritis and Musculoskeletal and Skin Diseases: www.niams.nih.gov National Institute on Aging: www.nia.nih.gov American College of Rheumatology: www.rheumatology.org Document Released: 08/14/2005  Document Revised: 06/04/2013 Document Reviewed: 04/21/2013 [employer] Patient Information 2014 [employer].  

## 2013-11-03 NOTE — Assessment & Plan Note (Addendum)
Check urinalysis, and culture maintain adequate hydration

## 2013-11-07 DIAGNOSIS — M79609 Pain in unspecified limb: Secondary | ICD-10-CM | POA: Diagnosis not present

## 2013-11-07 DIAGNOSIS — B351 Tinea unguium: Secondary | ICD-10-CM | POA: Diagnosis not present

## 2013-11-12 DIAGNOSIS — H353290 Exudative age-related macular degeneration, unspecified eye, stage unspecified: Secondary | ICD-10-CM | POA: Diagnosis not present

## 2013-11-12 DIAGNOSIS — H40029 Open angle with borderline findings, high risk, unspecified eye: Secondary | ICD-10-CM | POA: Diagnosis not present

## 2013-11-16 ENCOUNTER — Other Ambulatory Visit: Payer: Self-pay | Admitting: Family Medicine

## 2013-11-19 ENCOUNTER — Encounter: Payer: Self-pay | Admitting: Physician Assistant

## 2013-11-19 ENCOUNTER — Ambulatory Visit (INDEPENDENT_AMBULATORY_CARE_PROVIDER_SITE_OTHER): Payer: Medicare Other | Admitting: Physician Assistant

## 2013-11-19 VITALS — BP 110/70 | HR 56 | Temp 97.8°F | Ht 62.0 in | Wt 136.0 lb

## 2013-11-19 DIAGNOSIS — R4182 Altered mental status, unspecified: Secondary | ICD-10-CM | POA: Diagnosis not present

## 2013-11-19 DIAGNOSIS — F29 Unspecified psychosis not due to a substance or known physiological condition: Secondary | ICD-10-CM

## 2013-11-19 DIAGNOSIS — R3989 Other symptoms and signs involving the genitourinary system: Secondary | ICD-10-CM

## 2013-11-19 DIAGNOSIS — R399 Unspecified symptoms and signs involving the genitourinary system: Secondary | ICD-10-CM | POA: Insufficient documentation

## 2013-11-19 DIAGNOSIS — R410 Disorientation, unspecified: Secondary | ICD-10-CM | POA: Insufficient documentation

## 2013-11-19 LAB — BASIC METABOLIC PANEL
BUN: 28 mg/dL — AB (ref 6–23)
CO2: 28 mEq/L (ref 19–32)
Calcium: 9.1 mg/dL (ref 8.4–10.5)
Chloride: 104 mEq/L (ref 96–112)
Creat: 0.71 mg/dL (ref 0.50–1.10)
Glucose, Bld: 78 mg/dL (ref 70–99)
Potassium: 4.3 mEq/L (ref 3.5–5.3)
Sodium: 141 mEq/L (ref 135–145)

## 2013-11-19 LAB — CBC WITH DIFFERENTIAL/PLATELET
BASOS ABS: 0.1 10*3/uL (ref 0.0–0.1)
BASOS PCT: 1 % (ref 0–1)
EOS PCT: 3 % (ref 0–5)
Eosinophils Absolute: 0.2 10*3/uL (ref 0.0–0.7)
HEMATOCRIT: 34.9 % — AB (ref 36.0–46.0)
Hemoglobin: 11.9 g/dL — ABNORMAL LOW (ref 12.0–15.0)
Lymphocytes Relative: 25 % (ref 12–46)
Lymphs Abs: 1.5 10*3/uL (ref 0.7–4.0)
MCH: 27.7 pg (ref 26.0–34.0)
MCHC: 34.1 g/dL (ref 30.0–36.0)
MCV: 81.2 fL (ref 78.0–100.0)
MONO ABS: 0.7 10*3/uL (ref 0.1–1.0)
Monocytes Relative: 12 % (ref 3–12)
Neutro Abs: 3.5 10*3/uL (ref 1.7–7.7)
Neutrophils Relative %: 59 % (ref 43–77)
Platelets: 226 10*3/uL (ref 150–400)
RBC: 4.3 MIL/uL (ref 3.87–5.11)
RDW: 14.4 % (ref 11.5–15.5)
WBC: 6 10*3/uL (ref 4.0–10.5)

## 2013-11-19 LAB — POCT URINALYSIS DIPSTICK
Bilirubin, UA: NEGATIVE
Glucose, UA: NEGATIVE
Ketones, UA: NEGATIVE
Leukocytes, UA: NEGATIVE
NITRITE UA: NEGATIVE
PH UA: 5
Protein, UA: NEGATIVE
Spec Grav, UA: 1.015
Urobilinogen, UA: 0.2

## 2013-11-19 MED ORDER — CEFDINIR 300 MG PO CAPS: 300.0000 mg | ORAL_CAPSULE | Freq: Two times a day (BID) | ORAL | Status: DC

## 2013-11-19 NOTE — Assessment & Plan Note (Addendum)
Resolved on its own.  Likely due to vivid dream and patient being disoriented from dream upon awakening.  PE unremarkable.  UA, however, reveals moderate blood.  Patient with UTI symptoms and confusion so will empirically treat for UTI.  Rx Omnicef. Will also check CBC and BMP.  Follow-up in 1 week.

## 2013-11-19 NOTE — Assessment & Plan Note (Signed)
W/ episode of confusion.  UA shows moderate blood.  Will send for culture.  Empirically treat with Omnicef.  Increase fluids. Cranberry supplement.

## 2013-11-19 NOTE — Progress Notes (Signed)
Patient presents to clinic today c/o an episode of confusion this morning.  Patient states she was having a vivid dream.  When she woke up she felt disoriented and couldn't remember where she was.  Was able to call her daughter.  Daughter states mother was able to talk but seemed a little anxious and disoriented.  Was able to get to mother in ~ 20 minutes and her mother had calmed down.  Daughter states patient was alert and doing well.  Denies LH, dizziness, chest pain, syncope.  Denies focal neurologic defect.  Denies muscle weakness.  Does have history of UTI.  Does endorse increased urinary frequency and urgency.  Denies dysuria, hematuria, nausea or vomiting.  Denies fever.  Past Medical History  Diagnosis Date  . Osteoporosis   . Hypertension   . Transient memory loss 04/07/2011  . Right knee pain 10/20/2012  . Dermatitis, seborrheic 05/19/2013  . UTI (urinary tract infection) 05/19/2013    Current Outpatient Prescriptions on File Prior to Visit  Medication Sig Dispense Refill  . acetaminophen (TYLENOL) 500 MG tablet 1 tab po twice daily for pain  30 tablet  0  . atenolol (TENORMIN) 50 MG tablet Take 1 tablet (50 mg total) by mouth daily.  90 tablet  1  . b complex vitamins tablet Take 1 tablet by mouth daily.        . citalopram (CELEXA) 20 MG tablet TAKE 1 TABLET BY MOUTH DAILY  30 tablet  3  . diphenhydrAMINE (BENADRYL) 25 mg capsule Take 12.5 mg by mouth at bedtime as needed.       Marland Kitchen ibuprofen (ADVIL,MOTRIN) 200 MG tablet 1 tab po twice daily with food prn pain, not controlled by tylenol  30 tablet  0  . KRILL OIL PO Take by mouth.      . Loperamide HCl (IMODIUM PO) Take by mouth as needed.      . simethicone (MYLICON) 993 MG chewable tablet Chew 125 mg by mouth every 6 (six) hours as needed for flatulence.      . triamcinolone cream (KENALOG) 0.1 % Apply topically 2 (two) times daily as needed. Rash on neck  85.2 g  1  . vitamin C (ASCORBIC ACID) 500 MG tablet Take 500 mg by mouth  daily.        Marland Kitchen VITAMIN D, CHOLECALCIFEROL, PO Take 1 tablet by mouth daily.         No current facility-administered medications on file prior to visit.    Allergies  Allergen Reactions  . Penicillins     Family History  Problem Relation Age of Onset  . Diabetes      family hx    History   Social History  . Marital Status: Widowed    Spouse Name: N/A    Number of Children: N/A  . Years of Education: N/A   Social History Main Topics  . Smoking status: Never Smoker   . Smokeless tobacco: Never Used  . Alcohol Use: Yes  . Drug Use: None  . Sexual Activity: None   Other Topics Concern  . None   Social History Narrative  . None   Review of Systems - See HPI.  All other ROS are negative.  BP 110/70  Pulse 56  Temp(Src) 97.8 F (36.6 C) (Oral)  Ht 5\' 2"  (1.575 m)  Wt 136 lb (61.689 kg)  BMI 24.87 kg/m2  SpO2 97%  Physical Exam  Constitutional: She is oriented to person, place, and time and  well-developed, well-nourished, and in no distress.  HENT:  Head: Normocephalic and atraumatic.  Right Ear: External ear normal.  Left Ear: External ear normal.  Nose: Nose normal.  Mouth/Throat: Oropharynx is clear and moist. No oropharyngeal exudate.  TM within normal limits bilaterally.  No TTP of sinuses noted on examination.  Eyes: Conjunctivae are normal. Pupils are equal, round, and reactive to light.  Neck: Neck supple.  Cardiovascular: Normal rate, regular rhythm, normal heart sounds and intact distal pulses.   Pulmonary/Chest: Effort normal and breath sounds normal. No respiratory distress. She has no wheezes. She has no rales. She exhibits no tenderness.  Lymphadenopathy:    She has no cervical adenopathy.  Neurological: She is alert and oriented to person, place, and time.  Skin: Skin is warm and dry. No rash noted.  Psychiatric: Affect normal.   Recent Results (from the past 2160 hour(s))  POCT URINALYSIS DIPSTICK     Status: None   Collection Time     11/19/13 11:31 AM      Result Value Ref Range   Color, UA light yellow     Clarity, UA clear     Glucose, UA negative     Bilirubin, UA negative     Ketones, UA negative     Spec Grav, UA 1.015     Blood, UA moderate     pH, UA 5.0     Protein, UA negative     Urobilinogen, UA 0.2     Nitrite, UA negative     Leukocytes, UA Negative     Assessment/Plan: Confusion Resolved on its own.  Likely due to vivid dream and patient being disoriented from dream upon awakening.  PE unremarkable.  UA, however, reveals moderate blood.  Patient with UTI symptoms and confusion so will empirically treat for UTI.  Rx Omnicef. Will also check CBC and BMP.  Follow-up in 1 week.  UTI symptoms W/ episode of confusion.  UA shows moderate blood.  Will send for culture.  Empirically treat with Omnicef.  Increase fluids. Cranberry supplement.

## 2013-11-19 NOTE — Progress Notes (Signed)
Pre visit review using our clinic review tool, if applicable. No additional management support is needed unless otherwise documented below in the visit note. 

## 2013-11-19 NOTE — Patient Instructions (Signed)
Please take Omnicef as directed.  Take a cranberry supplement daily. Increase fluid intake.  Rest.  Please obtain labs.  I will call you with your results.  Follow-up with Dr. Charlett Blake in 1 week. If symptoms recur or worsen, please proceed to the ER.

## 2013-11-21 LAB — CULTURE, URINE COMPREHENSIVE
Colony Count: NO GROWTH
Organism ID, Bacteria: NO GROWTH

## 2013-11-24 ENCOUNTER — Other Ambulatory Visit: Payer: Self-pay | Admitting: Family Medicine

## 2013-11-26 ENCOUNTER — Ambulatory Visit: Payer: Medicare Other | Admitting: Physician Assistant

## 2013-12-31 ENCOUNTER — Encounter: Payer: Self-pay | Admitting: Physician Assistant

## 2013-12-31 ENCOUNTER — Ambulatory Visit (INDEPENDENT_AMBULATORY_CARE_PROVIDER_SITE_OTHER): Payer: Medicare Other | Admitting: Physician Assistant

## 2013-12-31 VITALS — BP 102/66 | HR 72 | Temp 98.3°F | Resp 14 | Ht 62.0 in | Wt 133.2 lb

## 2013-12-31 DIAGNOSIS — J209 Acute bronchitis, unspecified: Secondary | ICD-10-CM

## 2013-12-31 MED ORDER — CEFDINIR 300 MG PO CAPS: 300.0000 mg | ORAL_CAPSULE | Freq: Two times a day (BID) | ORAL | Status: DC

## 2013-12-31 NOTE — Progress Notes (Signed)
Pre visit review using our clinic review tool, if applicable. No additional management support is needed unless otherwise documented below in the visit note/SLS  

## 2013-12-31 NOTE — Patient Instructions (Signed)
Increase your fluid intake.  Rest.  Take a daily claritin.  Place a humidifier in bedroom.  Can use plain Mucinex for congestion.  IF no symptoms improvement in 24-28 hours, please begin antibiotic, taking as directed.

## 2013-12-31 NOTE — Progress Notes (Signed)
Patient presents to clinic today c/o ingestion chest congestion postnasal drip, scratchy throat and productive cough. Symptoms have been worsening over the past couple days. Patient has fever, chills, shortness of breath or wheezing. Denies recent travel or sick contacts.  Past Medical History  Diagnosis Date  . Osteoporosis   . Hypertension   . Transient memory loss 04/07/2011  . Right knee pain 10/20/2012  . Dermatitis, seborrheic 05/19/2013  . UTI (urinary tract infection) 05/19/2013    Current Outpatient Prescriptions on File Prior to Visit  Medication Sig Dispense Refill  . acetaminophen (TYLENOL) 500 MG tablet 1 tab po twice daily for pain  30 tablet  0  . atenolol (TENORMIN) 50 MG tablet TAKE 1 TABLET BY MOUTH ONCE DAILY  90 tablet  0  . b complex vitamins tablet Take 1 tablet by mouth daily.        . citalopram (CELEXA) 20 MG tablet TAKE 1 TABLET BY MOUTH DAILY  30 tablet  3  . diphenhydrAMINE (BENADRYL) 25 mg capsule Take 12.5 mg by mouth at bedtime as needed.       Marland Kitchen ibuprofen (ADVIL,MOTRIN) 200 MG tablet 1 tab po twice daily with food prn pain, not controlled by tylenol  30 tablet  0  . KRILL OIL PO Take by mouth.      . Loperamide HCl (IMODIUM PO) Take by mouth as needed.      . simethicone (MYLICON) 953 MG chewable tablet Chew 125 mg by mouth every 6 (six) hours as needed for flatulence.      . vitamin C (ASCORBIC ACID) 500 MG tablet Take 500 mg by mouth daily.        Marland Kitchen VITAMIN D, CHOLECALCIFEROL, PO Take 1 tablet by mouth daily.         No current facility-administered medications on file prior to visit.    Allergies  Allergen Reactions  . Penicillins     Family History  Problem Relation Age of Onset  . Diabetes      family hx    History   Social History  . Marital Status: Widowed    Spouse Name: N/A    Number of Children: N/A  . Years of Education: N/A   Social History Main Topics  . Smoking status: Never Smoker   . Smokeless tobacco: Never Used  .  Alcohol Use: Yes  . Drug Use: None  . Sexual Activity: None   Other Topics Concern  . None   Social History Narrative  . None   Review of Systems - See HPI.  All other ROS are negative.  BP 102/66  Pulse 72  Temp(Src) 98.3 F (36.8 C) (Oral)  Resp 14  Ht 5' 2"  (1.575 m)  Wt 133 lb 4 oz (60.442 kg)  BMI 24.37 kg/m2  SpO2 98%  Physical Exam  Vitals reviewed. Constitutional: She is oriented to person, place, and time and well-developed, well-nourished, and in no distress.  HENT:  Head: Normocephalic and atraumatic.  Right Ear: External ear normal.  Left Ear: External ear normal.  Nose: Nose normal.  Mouth/Throat: Oropharynx is clear and moist. No oropharyngeal exudate.  Tympanic membranes within normal limits bilaterally.  Eyes: Conjunctivae are normal. Pupils are equal, round, and reactive to light.  Neck: Neck supple.  Cardiovascular: Normal rate, regular rhythm, normal heart sounds and intact distal pulses.   Pulmonary/Chest: Effort normal and breath sounds normal. No respiratory distress. She has no wheezes. She has no rales. She exhibits no tenderness.  Lymphadenopathy:    She has no cervical adenopathy.  Neurological: She is alert and oriented to person, place, and time.  Skin: Skin is warm and dry. No rash noted.  Psychiatric: Affect normal.    Recent Results (from the past 2160 hour(s))  POCT URINALYSIS DIPSTICK     Status: None   Collection Time    11/19/13 11:31 AM      Result Value Ref Range   Color, UA light yellow     Clarity, UA clear     Glucose, UA negative     Bilirubin, UA negative     Ketones, UA negative     Spec Grav, UA 1.015     Blood, UA moderate     pH, UA 5.0     Protein, UA negative     Urobilinogen, UA 0.2     Nitrite, UA negative     Leukocytes, UA Negative    CULTURE, URINE COMPREHENSIVE     Status: None   Collection Time    11/19/13 12:04 PM      Result Value Ref Range   Colony Count NO GROWTH     Organism ID, Bacteria NO  GROWTH    CBC WITH DIFFERENTIAL     Status: Abnormal   Collection Time    11/19/13 12:14 PM      Result Value Ref Range   WBC 6.0  4.0 - 10.5 K/uL   RBC 4.30  3.87 - 5.11 MIL/uL   Hemoglobin 11.9 (*) 12.0 - 15.0 g/dL   HCT 34.9 (*) 36.0 - 46.0 %   MCV 81.2  78.0 - 100.0 fL   MCH 27.7  26.0 - 34.0 pg   MCHC 34.1  30.0 - 36.0 g/dL   RDW 14.4  11.5 - 15.5 %   Platelets 226  150 - 400 K/uL   Neutrophils Relative % 59  43 - 77 %   Neutro Abs 3.5  1.7 - 7.7 K/uL   Lymphocytes Relative 25  12 - 46 %   Lymphs Abs 1.5  0.7 - 4.0 K/uL   Monocytes Relative 12  3 - 12 %   Monocytes Absolute 0.7  0.1 - 1.0 K/uL   Eosinophils Relative 3  0 - 5 %   Eosinophils Absolute 0.2  0.0 - 0.7 K/uL   Basophils Relative 1  0 - 1 %   Basophils Absolute 0.1  0.0 - 0.1 K/uL   Smear Review Criteria for review not met    BASIC METABOLIC PANEL     Status: Abnormal   Collection Time    11/19/13 12:14 PM      Result Value Ref Range   Sodium 141  135 - 145 mEq/L   Potassium 4.3  3.5 - 5.3 mEq/L   Chloride 104  96 - 112 mEq/L   CO2 28  19 - 32 mEq/L   Glucose, Bld 78  70 - 99 mg/dL   BUN 28 (*) 6 - 23 mg/dL   Creat 0.71  0.50 - 1.10 mg/dL   Calcium 9.1  8.4 - 10.5 mg/dL    Assessment/Plan: Acute bronchitis Rx Omnicef. Increase fluid intake. Saline nasal spray. Rest. Probiotic. Humidifier in bedroom.

## 2014-01-01 DIAGNOSIS — J209 Acute bronchitis, unspecified: Secondary | ICD-10-CM | POA: Insufficient documentation

## 2014-01-01 NOTE — Assessment & Plan Note (Signed)
Rx Omnicef. Increase fluid intake. Saline nasal spray. Rest. Probiotic. Humidifier in bedroom.

## 2014-01-16 ENCOUNTER — Telehealth: Payer: Self-pay | Admitting: *Deleted

## 2014-01-16 DIAGNOSIS — B351 Tinea unguium: Secondary | ICD-10-CM | POA: Diagnosis not present

## 2014-01-16 DIAGNOSIS — M79609 Pain in unspecified limb: Secondary | ICD-10-CM | POA: Diagnosis not present

## 2014-01-16 NOTE — Telephone Encounter (Signed)
LMOM to CB. 

## 2014-01-16 NOTE — Telephone Encounter (Signed)
Caller states patient is doing much better from 05.06 OV for Bronchitis, but request recommendations on OTC products she could use to clear all the way up/sls Please Advise.

## 2014-01-16 NOTE — Telephone Encounter (Signed)
Mucinex 600 mg po bid x 10 days, probiotic such as PHillip's Colon Health daily and coldeeze x 7 days

## 2014-03-15 ENCOUNTER — Other Ambulatory Visit: Payer: Self-pay | Admitting: Family Medicine

## 2014-03-20 DIAGNOSIS — M79609 Pain in unspecified limb: Secondary | ICD-10-CM | POA: Diagnosis not present

## 2014-03-20 DIAGNOSIS — B351 Tinea unguium: Secondary | ICD-10-CM | POA: Diagnosis not present

## 2014-03-31 DIAGNOSIS — L821 Other seborrheic keratosis: Secondary | ICD-10-CM | POA: Diagnosis not present

## 2014-03-31 DIAGNOSIS — L820 Inflamed seborrheic keratosis: Secondary | ICD-10-CM | POA: Diagnosis not present

## 2014-03-31 DIAGNOSIS — L819 Disorder of pigmentation, unspecified: Secondary | ICD-10-CM | POA: Diagnosis not present

## 2014-04-02 ENCOUNTER — Encounter: Payer: Self-pay | Admitting: Family Medicine

## 2014-04-02 ENCOUNTER — Ambulatory Visit (INDEPENDENT_AMBULATORY_CARE_PROVIDER_SITE_OTHER): Payer: Medicare Other | Admitting: Family Medicine

## 2014-04-02 VITALS — BP 118/72 | HR 63 | Temp 98.8°F | Ht 61.5 in | Wt 134.1 lb

## 2014-04-02 DIAGNOSIS — R269 Unspecified abnormalities of gait and mobility: Secondary | ICD-10-CM

## 2014-04-02 DIAGNOSIS — M25561 Pain in right knee: Secondary | ICD-10-CM

## 2014-04-02 DIAGNOSIS — I10 Essential (primary) hypertension: Secondary | ICD-10-CM

## 2014-04-02 DIAGNOSIS — M25569 Pain in unspecified knee: Secondary | ICD-10-CM | POA: Diagnosis not present

## 2014-04-02 DIAGNOSIS — F329 Major depressive disorder, single episode, unspecified: Secondary | ICD-10-CM | POA: Diagnosis not present

## 2014-04-02 DIAGNOSIS — F3289 Other specified depressive episodes: Secondary | ICD-10-CM

## 2014-04-02 DIAGNOSIS — Z0000 Encounter for general adult medical examination without abnormal findings: Secondary | ICD-10-CM

## 2014-04-02 DIAGNOSIS — G8929 Other chronic pain: Secondary | ICD-10-CM | POA: Diagnosis not present

## 2014-04-02 DIAGNOSIS — J209 Acute bronchitis, unspecified: Secondary | ICD-10-CM

## 2014-04-02 DIAGNOSIS — F32A Depression, unspecified: Secondary | ICD-10-CM

## 2014-04-02 DIAGNOSIS — R2681 Unsteadiness on feet: Secondary | ICD-10-CM

## 2014-04-02 DIAGNOSIS — R413 Other amnesia: Secondary | ICD-10-CM | POA: Diagnosis not present

## 2014-04-02 LAB — CBC
HCT: 36.3 % (ref 36.0–46.0)
Hemoglobin: 12.3 g/dL (ref 12.0–15.0)
MCH: 27.9 pg (ref 26.0–34.0)
MCHC: 33.9 g/dL (ref 30.0–36.0)
MCV: 82.3 fL (ref 78.0–100.0)
PLATELETS: 242 10*3/uL (ref 150–400)
RBC: 4.41 MIL/uL (ref 3.87–5.11)
RDW: 13.9 % (ref 11.5–15.5)
WBC: 7 10*3/uL (ref 4.0–10.5)

## 2014-04-02 MED ORDER — NAPROXEN SODIUM 220 MG PO TABS: 220.0000 mg | ORAL_TABLET | Freq: Every day | ORAL | Status: DC | PRN

## 2014-04-02 NOTE — Patient Instructions (Addendum)
Look at tongue in 2-4 weeks if purple spot on left still present then call for referral  Bring in advanced directive paperwork

## 2014-04-02 NOTE — Progress Notes (Signed)
Pre visit review using our clinic review tool, if applicable. No additional management support is needed unless otherwise documented below in the visit note. 

## 2014-04-03 ENCOUNTER — Telehealth: Payer: Self-pay | Admitting: Family Medicine

## 2014-04-03 LAB — HEPATIC FUNCTION PANEL
ALBUMIN: 4.1 g/dL (ref 3.5–5.2)
ALT: 15 U/L (ref 0–35)
AST: 21 U/L (ref 0–37)
Alkaline Phosphatase: 91 U/L (ref 39–117)
BILIRUBIN TOTAL: 0.6 mg/dL (ref 0.2–1.2)
Bilirubin, Direct: 0.1 mg/dL (ref 0.0–0.3)
Indirect Bilirubin: 0.5 mg/dL (ref 0.2–1.2)
Total Protein: 6.7 g/dL (ref 6.0–8.3)

## 2014-04-03 LAB — TSH: TSH: 1.385 u[IU]/mL (ref 0.350–4.500)

## 2014-04-03 LAB — RENAL FUNCTION PANEL
Albumin: 4.1 g/dL (ref 3.5–5.2)
BUN: 23 mg/dL (ref 6–23)
CO2: 30 mEq/L (ref 19–32)
CREATININE: 0.75 mg/dL (ref 0.50–1.10)
Calcium: 9.7 mg/dL (ref 8.4–10.5)
Chloride: 101 mEq/L (ref 96–112)
Glucose, Bld: 85 mg/dL (ref 70–99)
PHOSPHORUS: 3.6 mg/dL (ref 2.3–4.6)
POTASSIUM: 4.3 meq/L (ref 3.5–5.3)
Sodium: 139 mEq/L (ref 135–145)

## 2014-04-03 NOTE — Telephone Encounter (Signed)
Relevant patient education mailed to patient.  

## 2014-04-05 ENCOUNTER — Encounter: Payer: Self-pay | Admitting: Family Medicine

## 2014-04-05 DIAGNOSIS — Z0000 Encounter for general adult medical examination without abnormal findings: Secondary | ICD-10-CM

## 2014-04-05 DIAGNOSIS — R2681 Unsteadiness on feet: Secondary | ICD-10-CM | POA: Insufficient documentation

## 2014-04-05 HISTORY — DX: Encounter for general adult medical examination without abnormal findings: Z00.00

## 2014-04-05 NOTE — Assessment & Plan Note (Signed)
Discussed possibility of medications but has chosen not to proceed

## 2014-04-05 NOTE — Assessment & Plan Note (Signed)
Asymptomatic at this time 

## 2014-04-05 NOTE — Assessment & Plan Note (Signed)
Good response to antibiotics, asymptomatic now

## 2014-04-05 NOTE — Assessment & Plan Note (Signed)
Referred for a course of physical therapy to help with her strength

## 2014-04-05 NOTE — Assessment & Plan Note (Signed)
Well controlled, no changes to meds. Encouraged heart healthy diet such as the DASH diet and exercise as tolerated.  °

## 2014-04-05 NOTE — Assessment & Plan Note (Addendum)
Patient denies any difficulties at home. No trouble with ADLs, depression or falls. No recent changes to vision or hearing. Is UTD with immunizations. Is UTD with screening. Discussed Advanced Directives, patient agrees to bring [place] copies of documents if can. Encouraged heart healthy diet, exercise as tolerated and adequate sleep. EKG normal today. Labs reviewed, she and her daughter agree to bring in paperwork for advanced directives. No longer undergoes colonoscopy, [facility]GM paps. Sees Dermatology both Dr Delman Cheadle and in her faciliity. [address] [place], [place] Sees Dr Lorre Nick at [facility]

## 2014-04-05 NOTE — Assessment & Plan Note (Signed)
Resolved on lab work 

## 2014-04-05 NOTE — Progress Notes (Signed)
Patient ID: Sheryl Lang, female   DOB: 1921-05-03, 80 y.o.   MRN: [id number] Sheryl Lang [id number] 1920-09-29 04/05/2014      Progress Note-Follow Up  Subjective  Chief Complaint  Chief Complaint  Patient presents with  . Annual Exam    medicare wellness    HPI  Patient is a 80 year old female in today for routine medical care. In today with her daughter for annual exam. No recent illness or concerns. Has been struggling with some falls and unsteady gait but no syncope or broken bones. Her knee pain has worsened making her unsteady. Just has some SK lesions on her neck frozen by dermatology and tolerated the procedure well. Denies CP/palp/SOB/HA/congestion/fevers/GI or GU c/o. Taking meds as prescribed  Past Medical History  Diagnosis Date  . Osteoporosis   . Hypertension   . Transient memory loss 04/07/2011  . Right knee pain 10/20/2012  . Dermatitis, seborrheic 05/19/2013  . UTI (urinary tract infection) 05/19/2013  . Urinary tract infection, site not specified 05/19/2013  . Medicare annual wellness visit, subsequent 04/05/2014    History reviewed. No pertinent past surgical history.  Family History  Problem Relation Age of Onset  . Diabetes      family hx  . Mental illness Brother     suicide  . Diabetes Paternal Grandmother   . Diabetes Paternal Grandfather   . Diabetes Sister     History   Social History  . Marital Status: Widowed    Spouse Name: N/A    Number of Children: N/A  . Years of Education: N/A   Occupational History  . Not on file.   Social History Main Topics  . Smoking status: Never Smoker   . Smokeless tobacco: Never Used  . Alcohol Use: Yes  . Drug Use: Not on file  . Sexual Activity: No     Comment: lives at [facility]   Other Topics Concern  . Not on file   Social History Narrative  . No narrative on file    Current Outpatient Prescriptions on File Prior to Visit  Medication Sig Dispense Refill  . acetaminophen (TYLENOL) 500  MG tablet 1 tab po twice daily for pain  30 tablet  0  . atenolol (TENORMIN) 50 MG tablet TAKE 1 TABLET BY MOUTH ONCE DAILY  90 tablet  0  . b complex vitamins tablet Take 1 tablet by mouth daily.        . citalopram (CELEXA) 20 MG tablet TAKE 1 TABLET BY MOUTH DAILY  30 tablet  0  . diphenhydrAMINE (BENADRYL) 25 mg capsule Take 12.5 mg by mouth at bedtime as needed.       . Loperamide HCl (IMODIUM PO) Take by mouth as needed.      . simethicone (MYLICON) 563 MG chewable tablet Chew 125 mg by mouth every 6 (six) hours as needed for flatulence.      . vitamin C (ASCORBIC ACID) 500 MG tablet Take 500 mg by mouth daily.        Marland Kitchen VITAMIN D, CHOLECALCIFEROL, PO Take 1 tablet by mouth daily.        Marland Kitchen KRILL OIL PO Take by mouth.       No current facility-administered medications on file prior to visit.    Allergies  Allergen Reactions  . Penicillins     Review of Systems  Review of Systems  Constitutional: Negative for fever, chills and malaise/fatigue.  HENT: Positive for hearing loss. Negative for  congestion and nosebleeds.   Eyes: Negative for discharge.  Respiratory: Negative for cough, sputum production, shortness of breath and wheezing.   Cardiovascular: Negative for chest pain, palpitations and leg swelling.  Gastrointestinal: Negative for heartburn, nausea, vomiting, abdominal pain, diarrhea, constipation and blood in stool.  Genitourinary: Negative for dysuria, urgency, frequency and hematuria.  Musculoskeletal: Negative for back pain, falls and myalgias.  Skin: Negative for rash.  Neurological: Negative for dizziness, tremors, sensory change, focal weakness, loss of consciousness, weakness and headaches.  Endo/Heme/Allergies: Negative for polydipsia. Does not bruise/bleed easily.  Psychiatric/Behavioral: Negative for depression and suicidal ideas. The patient is not nervous/anxious and does not have insomnia.     Objective  BP 118/72  Pulse 63  Temp(Src) 98.8 F (37.1 C)  (Oral)  Ht 5' 1.5" (1.562 m)  Wt 134 lb 1.9 oz (60.836 kg)  BMI 24.93 kg/m2  SpO2 96%  Physical Exam  Physical Exam  Constitutional: She is oriented to person, place, and time and well-developed, well-nourished, and in no distress. No distress.  HENT:  Head: Normocephalic and atraumatic.  Right Ear: External ear normal.  Left Ear: External ear normal.  Nose: Nose normal.  Mouth/Throat: Oropharynx is clear and moist. No oropharyngeal exudate.  Eyes: Conjunctivae are normal. Pupils are equal, round, and reactive to light. Right eye exhibits no discharge. Left eye exhibits no discharge. No scleral icterus.  Neck: Normal range of motion. Neck supple. No thyromegaly present.  Cardiovascular: Normal rate, regular rhythm, normal heart sounds and intact distal pulses.   No murmur heard. Pulmonary/Chest: Effort normal and breath sounds normal. No respiratory distress. She has no wheezes. She has no rales.  Abdominal: Soft. Bowel sounds are normal. She exhibits no distension and no mass. There is no tenderness.  Musculoskeletal: Normal range of motion. She exhibits no edema and no tenderness.  Lymphadenopathy:    She has no cervical adenopathy.  Neurological: She is alert and oriented to person, place, and time. She has normal reflexes. No cranial nerve deficit. Coordination normal.  Skin: Skin is warm and dry. No rash noted. She is not diaphoretic.  Psychiatric: Mood, memory and affect normal.    Lab Results  Component Value Date   TSH 1.385 04/02/2014   Lab Results  Component Value Date   WBC 7.0 04/02/2014   HGB 12.3 04/02/2014   HCT 36.3 04/02/2014   MCV 82.3 04/02/2014   PLT 242 04/02/2014   Lab Results  Component Value Date   CREATININE 0.75 04/02/2014   BUN 23 04/02/2014   NA 139 04/02/2014   K 4.3 04/02/2014   CL 101 04/02/2014   CO2 30 04/02/2014   Lab Results  Component Value Date   ALT 15 04/02/2014   AST 21 04/02/2014   ALKPHOS 91 04/02/2014   BILITOT 0.6 04/02/2014   No results found for  this basename: CHOL   No results found for this basename: HDL   No results found for this basename: LDLCALC   No results found for this basename: TRIG   No results found for this basename: CHOLHDL     Assessment & Plan  HYPERTENSION Well controlled, no changes to meds. Encouraged heart healthy diet such as the DASH diet and exercise as tolerated.   Hypercalcemia Resolved on labwork  Acute bronchitis Good response to antibiotics, asymptomatic now  Urinary tract infection, site not specified Asymptomatic at this time  Right knee pain Follows with Dr Lorre Nick of [facility], no recent injury but referred back for increasing  pain  Depression Doing well on Citalopram  Memory loss Discussed possibility of medications but has chosen not to proceed  Unsteady gait Referred for a course of physical therapy to help with her strength  Medicare annual wellness visit, subsequent Patient denies any difficulties at home. No trouble with ADLs, depression or falls. No recent changes to vision or hearing. Is UTD with immunizations. Is UTD with screening. Discussed Advanced Directives, patient agrees to bring [place] copies of documents if can. Encouraged heart healthy diet, exercise as tolerated and adequate sleep. EKG normal today. Labs reviewed, she and her daughter agree to bring in paperwork for advanced directives. No longer undergoes colonoscopy, MGM paps. Sees Dermatology both Dr Delman Cheadle and in her faciliity. [address], [place], [place] Sees Dr Lorre Nick at [facility]

## 2014-04-05 NOTE — Assessment & Plan Note (Addendum)
Follows with Dr Lorre Nick of [facility], no recent injury but referred back for increasing pain

## 2014-04-05 NOTE — Assessment & Plan Note (Signed)
Doing well on Citalopram 

## 2014-04-08 ENCOUNTER — Telehealth: Payer: Self-pay

## 2014-04-08 NOTE — Telephone Encounter (Signed)
Sheryl Lang (pts daughter) stated that as of yesterday [name] hasn't scheduled Physical Therapy. Another thing is that she forgot to mention at pts last appt that pt has a hernia in lower abdomen or lower pelvic area. It has gotten bigger. YOu can see it through her clothes but it doesn't bother her. She would look for pt to have an appt  So it could be looked at."  I tried to contact physical therapy but there was no answer. Left message for them to return my call. LDM

## 2014-04-16 DIAGNOSIS — M25569 Pain in unspecified knee: Secondary | ICD-10-CM | POA: Diagnosis not present

## 2014-04-16 DIAGNOSIS — M1710 Unilateral primary osteoarthritis, unspecified knee: Secondary | ICD-10-CM | POA: Diagnosis not present

## 2014-04-21 ENCOUNTER — Telehealth: Payer: Self-pay | Admitting: Family Medicine

## 2014-04-21 ENCOUNTER — Other Ambulatory Visit: Payer: Self-pay | Admitting: Family Medicine

## 2014-04-21 DIAGNOSIS — R2681 Unsteadiness on feet: Secondary | ICD-10-CM

## 2014-04-21 NOTE — Telephone Encounter (Signed)
Please call Brook from [facility]. She said that the patient told her that we were going to send over an order for PT eval and treat and they have not received the order.

## 2014-04-21 NOTE — Telephone Encounter (Signed)
done

## 2014-04-21 NOTE — Telephone Encounter (Signed)
Are you aware of this 

## 2014-04-27 ENCOUNTER — Other Ambulatory Visit: Payer: Self-pay | Admitting: Family Medicine

## 2014-04-27 DIAGNOSIS — L819 Disorder of pigmentation, unspecified: Secondary | ICD-10-CM | POA: Diagnosis not present

## 2014-04-27 DIAGNOSIS — L820 Inflamed seborrheic keratosis: Secondary | ICD-10-CM | POA: Diagnosis not present

## 2014-04-29 DIAGNOSIS — R269 Unspecified abnormalities of gait and mobility: Secondary | ICD-10-CM | POA: Diagnosis not present

## 2014-04-29 DIAGNOSIS — M6281 Muscle weakness (generalized): Secondary | ICD-10-CM | POA: Diagnosis not present

## 2014-04-29 DIAGNOSIS — R279 Unspecified lack of coordination: Secondary | ICD-10-CM | POA: Diagnosis not present

## 2014-04-29 NOTE — Telephone Encounter (Signed)
I spoke to patients daughter and she is having physical therapy today.  FYI:  pts daughter would like md to know that pt has a large hernia that sometimes appears through her clothes but doesn't bother her.  They just want MD to be aware of this. If MD is ok with waiting she will wait until next appt to have md look at it.

## 2014-05-05 ENCOUNTER — Telehealth: Payer: Self-pay

## 2014-05-05 DIAGNOSIS — R269 Unspecified abnormalities of gait and mobility: Secondary | ICD-10-CM | POA: Diagnosis not present

## 2014-05-05 DIAGNOSIS — M6281 Muscle weakness (generalized): Secondary | ICD-10-CM | POA: Diagnosis not present

## 2014-05-05 DIAGNOSIS — R279 Unspecified lack of coordination: Secondary | ICD-10-CM | POA: Diagnosis not present

## 2014-05-05 NOTE — Telephone Encounter (Signed)
Per md need to hear from patient that she wants Dr Charlett Blake to fill out the DNR forms? I spoke to daughter and she had me call pt at [phone] (left a message for pt to return my call). pts daughter did state that we may have to explain to pt about the forms and remind her about the bright yellow papers.

## 2014-05-05 NOTE — Telephone Encounter (Signed)
We received 3 DNR forms for pt to fill out for patient on Friday 05-01-14. Dr Charlett Blake signed these on 05-05-14

## 2014-05-07 DIAGNOSIS — M6281 Muscle weakness (generalized): Secondary | ICD-10-CM | POA: Diagnosis not present

## 2014-05-07 DIAGNOSIS — R279 Unspecified lack of coordination: Secondary | ICD-10-CM | POA: Diagnosis not present

## 2014-05-07 DIAGNOSIS — R269 Unspecified abnormalities of gait and mobility: Secondary | ICD-10-CM | POA: Diagnosis not present

## 2014-05-12 DIAGNOSIS — R269 Unspecified abnormalities of gait and mobility: Secondary | ICD-10-CM | POA: Diagnosis not present

## 2014-05-12 DIAGNOSIS — M6281 Muscle weakness (generalized): Secondary | ICD-10-CM | POA: Diagnosis not present

## 2014-05-12 DIAGNOSIS — R279 Unspecified lack of coordination: Secondary | ICD-10-CM | POA: Diagnosis not present

## 2014-05-14 DIAGNOSIS — M6281 Muscle weakness (generalized): Secondary | ICD-10-CM | POA: Diagnosis not present

## 2014-05-14 DIAGNOSIS — R279 Unspecified lack of coordination: Secondary | ICD-10-CM | POA: Diagnosis not present

## 2014-05-14 DIAGNOSIS — R269 Unspecified abnormalities of gait and mobility: Secondary | ICD-10-CM | POA: Diagnosis not present

## 2014-05-20 NOTE — Telephone Encounter (Signed)
Caterine Mcmeans Self [phone] Lemmie Evens)  Joaquim Lai returned call, she stated that she defiantly wants DNR forms filled out for her, she went on to tell me about when her husband died that she had to show them so they would not resuscitate him. They had discussed this and neither one wants to be resuscitated.

## 2014-05-21 DIAGNOSIS — R279 Unspecified lack of coordination: Secondary | ICD-10-CM | POA: Diagnosis not present

## 2014-05-21 DIAGNOSIS — M6281 Muscle weakness (generalized): Secondary | ICD-10-CM | POA: Diagnosis not present

## 2014-05-21 DIAGNOSIS — R269 Unspecified abnormalities of gait and mobility: Secondary | ICD-10-CM | POA: Diagnosis not present

## 2014-05-21 NOTE — Telephone Encounter (Signed)
Paperwork given to md

## 2014-05-22 DIAGNOSIS — B351 Tinea unguium: Secondary | ICD-10-CM | POA: Diagnosis not present

## 2014-05-22 DIAGNOSIS — M79609 Pain in unspecified limb: Secondary | ICD-10-CM | POA: Diagnosis not present

## 2014-05-26 DIAGNOSIS — R269 Unspecified abnormalities of gait and mobility: Secondary | ICD-10-CM | POA: Diagnosis not present

## 2014-05-26 DIAGNOSIS — R279 Unspecified lack of coordination: Secondary | ICD-10-CM | POA: Diagnosis not present

## 2014-05-26 DIAGNOSIS — M6281 Muscle weakness (generalized): Secondary | ICD-10-CM | POA: Diagnosis not present

## 2014-05-28 DIAGNOSIS — R261 Paralytic gait: Secondary | ICD-10-CM | POA: Diagnosis not present

## 2014-06-02 DIAGNOSIS — L218 Other seborrheic dermatitis: Secondary | ICD-10-CM | POA: Diagnosis not present

## 2014-06-02 DIAGNOSIS — L814 Other melanin hyperpigmentation: Secondary | ICD-10-CM | POA: Diagnosis not present

## 2014-06-02 DIAGNOSIS — L821 Other seborrheic keratosis: Secondary | ICD-10-CM | POA: Diagnosis not present

## 2014-06-04 DIAGNOSIS — R261 Paralytic gait: Secondary | ICD-10-CM | POA: Diagnosis not present

## 2014-06-09 DIAGNOSIS — R261 Paralytic gait: Secondary | ICD-10-CM | POA: Diagnosis not present

## 2014-06-11 DIAGNOSIS — R261 Paralytic gait: Secondary | ICD-10-CM | POA: Diagnosis not present

## 2014-06-16 DIAGNOSIS — R261 Paralytic gait: Secondary | ICD-10-CM | POA: Diagnosis not present

## 2014-06-18 DIAGNOSIS — R261 Paralytic gait: Secondary | ICD-10-CM | POA: Diagnosis not present

## 2014-06-23 DIAGNOSIS — R261 Paralytic gait: Secondary | ICD-10-CM | POA: Diagnosis not present

## 2014-06-25 DIAGNOSIS — R261 Paralytic gait: Secondary | ICD-10-CM | POA: Diagnosis not present

## 2014-06-30 DIAGNOSIS — L218 Other seborrheic dermatitis: Secondary | ICD-10-CM | POA: Diagnosis not present

## 2014-06-30 DIAGNOSIS — L820 Inflamed seborrheic keratosis: Secondary | ICD-10-CM | POA: Diagnosis not present

## 2014-07-02 ENCOUNTER — Other Ambulatory Visit: Payer: Self-pay | Admitting: Family Medicine

## 2014-07-02 DIAGNOSIS — R261 Paralytic gait: Secondary | ICD-10-CM | POA: Diagnosis not present

## 2014-07-07 DIAGNOSIS — R261 Paralytic gait: Secondary | ICD-10-CM | POA: Diagnosis not present

## 2014-07-09 DIAGNOSIS — R261 Paralytic gait: Secondary | ICD-10-CM | POA: Diagnosis not present

## 2014-07-16 DIAGNOSIS — R261 Paralytic gait: Secondary | ICD-10-CM | POA: Diagnosis not present

## 2014-07-21 DIAGNOSIS — R261 Paralytic gait: Secondary | ICD-10-CM | POA: Diagnosis not present

## 2014-07-28 DIAGNOSIS — L820 Inflamed seborrheic keratosis: Secondary | ICD-10-CM | POA: Diagnosis not present

## 2014-07-30 ENCOUNTER — Other Ambulatory Visit: Payer: Self-pay | Admitting: Family Medicine

## 2014-07-31 DIAGNOSIS — M79675 Pain in left toe(s): Secondary | ICD-10-CM | POA: Diagnosis not present

## 2014-07-31 DIAGNOSIS — M79674 Pain in right toe(s): Secondary | ICD-10-CM | POA: Diagnosis not present

## 2014-07-31 DIAGNOSIS — B351 Tinea unguium: Secondary | ICD-10-CM | POA: Diagnosis not present

## 2014-08-26 ENCOUNTER — Other Ambulatory Visit: Payer: Self-pay | Admitting: Family Medicine

## 2014-08-27 NOTE — Telephone Encounter (Signed)
Med filled.  

## 2014-09-14 ENCOUNTER — Other Ambulatory Visit: Payer: Self-pay | Admitting: Family Medicine

## 2014-09-27 ENCOUNTER — Other Ambulatory Visit: Payer: Self-pay | Admitting: Family Medicine

## 2014-09-28 NOTE — Telephone Encounter (Signed)
Follow-up scheduled for 10/05/14.  Rx sent until that time.  eal

## 2014-10-01 ENCOUNTER — Telehealth: Payer: Self-pay | Admitting: Family Medicine

## 2014-10-01 NOTE — Telephone Encounter (Signed)
Caller name: Salley Scarlet Relation to pt: daughter  Call back number: [phone] Pharmacy: [id number] - [place], [place] [phone] (Phone) [phone] (Fax)     Reason for call:  Daughter canceled mother 6 month follow up appointment for 10/05/14 due to daughter work scheduled. Daughter [facility] appointment to 11/10/14. Daughter requesting a refill citalopram (CELEXA) 20 MG tablet to hold pt over until next appointment.

## 2014-10-02 DIAGNOSIS — B351 Tinea unguium: Secondary | ICD-10-CM | POA: Diagnosis not present

## 2014-10-02 DIAGNOSIS — M79674 Pain in right toe(s): Secondary | ICD-10-CM | POA: Diagnosis not present

## 2014-10-02 DIAGNOSIS — M79675 Pain in left toe(s): Secondary | ICD-10-CM | POA: Diagnosis not present

## 2014-10-02 MED ORDER — CITALOPRAM HYDROBROMIDE 20 MG PO TABS: 20.0000 mg | ORAL_TABLET | Freq: Every day | ORAL | Status: DC

## 2014-10-02 NOTE — Telephone Encounter (Signed)
Rx sent to the pharmacy by e-script.//AB/CMA 

## 2014-10-05 ENCOUNTER — Ambulatory Visit: Payer: Medicare Other | Admitting: Family Medicine

## 2014-10-12 ENCOUNTER — Telehealth: Payer: Self-pay | Admitting: *Deleted

## 2014-10-12 NOTE — Telephone Encounter (Signed)
Patient Unavailable; spoke with daughter RE: cut on patient's foot w/rednees around it; more of a small puncture without swelling and/or bright redness and no draiange. Pt has appt tomorrow 02.16.16 to evaluate; informed pt's daughter if any symptoms discussed become present prior to appt to proceed to urgent care/ED facility for assessment, understood & agreed/SLS

## 2014-10-13 ENCOUNTER — Encounter: Payer: Self-pay | Admitting: Physician Assistant

## 2014-10-13 ENCOUNTER — Ambulatory Visit (INDEPENDENT_AMBULATORY_CARE_PROVIDER_SITE_OTHER): Payer: Medicare Other | Admitting: Physician Assistant

## 2014-10-13 VITALS — BP 121/59 | HR 85 | Temp 98.5°F | Resp 14 | Ht 61.5 in | Wt 134.0 lb

## 2014-10-13 DIAGNOSIS — S91339A Puncture wound without foreign body, unspecified foot, initial encounter: Secondary | ICD-10-CM | POA: Insufficient documentation

## 2014-10-13 DIAGNOSIS — S91332A Puncture wound without foreign body, left foot, initial encounter: Secondary | ICD-10-CM | POA: Diagnosis not present

## 2014-10-13 DIAGNOSIS — Z23 Encounter for immunization: Secondary | ICD-10-CM | POA: Diagnosis not present

## 2014-10-13 MED ORDER — IBUPROFEN 200 MG PO TABS
400.0000 mg | ORAL_TABLET | Freq: Once | ORAL | Status: AC
  Administered 2014-10-13: 400 mg via ORAL

## 2014-10-13 MED ORDER — DOXYCYCLINE HYCLATE 100 MG PO CAPS: 100.0000 mg | ORAL_CAPSULE | Freq: Two times a day (BID) | ORAL | Status: DC

## 2014-10-13 NOTE — Addendum Note (Signed)
Addended by: Rockwell Germany on: 10/13/2014 12:07 PM   Modules accepted: Orders

## 2014-10-13 NOTE — Patient Instructions (Signed)
Please take antibiotic as directed.  Alternate tylenol and ibuprofen as needed for pain.  Ice will help with swelling and tenderness.  Improvement should be noted within 24 hours.  If everything is improving, follow-up with me on Monday. If anything worsens or you develop fever, please go directly to the ER as IV antibiotics may be needed.   Cellulitis Cellulitis is an infection of the skin and the tissue beneath it. The infected area is usually red and tender. Cellulitis occurs most often in the arms and lower legs.  CAUSES  Cellulitis is caused by bacteria that enter the skin through cracks or cuts in the skin. The most common types of bacteria that cause cellulitis are staphylococci and streptococci. SIGNS AND SYMPTOMS   Redness and warmth.  Swelling.  Tenderness or pain.  Fever. DIAGNOSIS  Your health care provider can usually determine what is wrong based on a physical exam. Blood tests may also be done. TREATMENT  Treatment usually involves taking an antibiotic medicine. HOME CARE INSTRUCTIONS   Take your antibiotic medicine as directed by your health care provider. Finish the antibiotic even if you start to feel better.  Keep the infected arm or leg elevated to reduce swelling.  Apply a warm cloth to the affected area up to 4 times per day to relieve pain.  Take medicines only as directed by your health care provider.  Keep all follow-up visits as directed by your health care provider. SEEK MEDICAL CARE IF:   You notice red streaks coming from the infected area.  Your red area gets larger or turns dark in color.  Your bone or joint underneath the infected area becomes painful after the skin has healed.  Your infection returns in the same area or another area.  You notice a swollen bump in the infected area.  You develop new symptoms.  You have a fever. SEEK IMMEDIATE MEDICAL CARE IF:   You feel very sleepy.  You develop vomiting or diarrhea.  You have a  general ill feeling (malaise) with muscle aches and pains. MAKE SURE YOU:   Understand these instructions.  Will watch your condition.  Will get help right away if you are not doing well or get worse. Document Released: 05/24/2005 Document Revised: 12/29/2013 Document Reviewed: 10/30/2011 [facility] Patient Information 2015 [place], [place]. This information is not intended to replace advice given to you by your health care provider. Make sure you discuss any questions you have with your health care provider.

## 2014-10-13 NOTE — Progress Notes (Signed)
Patient presents to clinic today c/o redness, swelling and pain of left foot after suffering a puncture wound 2 days ago.  Patient endorses she thinks she stepped on a staple at her home.  Did not actually see what she stepped on.  Endorses mild pain at first but since has developed her current symptoms. Denies fever, chills.  Denies sensation of foreign object or drainage from site.   Past Medical History  Diagnosis Date  . Osteoporosis   . Hypertension   . Transient memory loss 04/07/2011  . Right knee pain 10/20/2012  . Dermatitis, seborrheic 05/19/2013  . UTI (urinary tract infection) 05/19/2013  . Urinary tract infection, site not specified 05/19/2013  . Medicare annual wellness visit, subsequent 04/05/2014    Current Outpatient Prescriptions on File Prior to Visit  Medication Sig Dispense Refill  . acetaminophen (TYLENOL) 500 MG tablet 1 tab po twice daily for pain 30 tablet 0  . atenolol (TENORMIN) 50 MG tablet TAKE 1 TABLET BY MOUTH DAILY 90 tablet 0  . b complex vitamins tablet Take 1 tablet by mouth daily.      . citalopram (CELEXA) 20 MG tablet Take 1 tablet (20 mg total) by mouth daily. 30 tablet 1  . CRANBERRY PO Take 1 tablet by mouth daily.    . Loperamide HCl (IMODIUM PO) Take by mouth as needed.    . naproxen sodium (ANAPROX) 220 MG tablet Take 1 tablet (220 mg total) by mouth daily as needed. SEVERE PAIN, WITH FOOD    . simethicone (MYLICON) 299 MG chewable tablet Chew 125 mg by mouth every 6 (six) hours as needed for flatulence.    . vitamin C (ASCORBIC ACID) 500 MG tablet Take 500 mg by mouth daily.      Marland Kitchen VITAMIN D, CHOLECALCIFEROL, PO Take 1 tablet by mouth daily.      . diphenhydrAMINE (BENADRYL) 25 mg capsule Take 12.5 mg by mouth at bedtime as needed.     Marland Kitchen KRILL OIL PO Take by mouth.     No current facility-administered medications on file prior to visit.    Allergies  Allergen Reactions  . Penicillins     Family History  Problem Relation Age of Onset    . Diabetes      family hx  . Mental illness Brother     suicide  . Diabetes Paternal Grandmother   . Diabetes Paternal Grandfather   . Diabetes Sister     History   Social History  . Marital Status: Widowed    Spouse Name: N/A  . Number of Children: N/A  . Years of Education: N/A   Social History Main Topics  . Smoking status: Never Smoker   . Smokeless tobacco: Never Used  . Alcohol Use: Yes  . Drug Use: Not on file  . Sexual Activity: No     Comment: lives at [facility]   Other Topics Concern  . None   Social History Narrative   Review of Systems - See HPI.  All other ROS are negative.  BP 121/59 mmHg  Pulse 85  Temp(Src) 98.5 F (36.9 C) (Oral)  Resp 14  Ht 5' 1.5" (1.562 m)  Wt 134 lb (60.782 kg)  BMI 24.91 kg/m2  SpO2 98%  Physical Exam  Constitutional: She is oriented to person, place, and time and well-developed, well-nourished, and in no distress.  HENT:  Head: Normocephalic and atraumatic.  Cardiovascular: Normal rate, regular rhythm, normal heart sounds and intact distal pulses.  Pulmonary/Chest: Effort normal and breath sounds normal. No respiratory distress.  Neurological: She is alert and oriented to person, place, and time.  Skin: Skin is warm and dry.     Psychiatric: Affect normal.  Vitals reviewed.    Assessment/Plan: Puncture wound of foot excluding toes with complication Td given by nursing staff. Will begin Doxycycline BID x 7 days.  Pain management and supportive measures discussed.  Follow-up on Monday if improving.  If anything worsens or if fever, chills develop, she is to proceed to ER.

## 2014-10-13 NOTE — Addendum Note (Signed)
Addended by: Rockwell Germany on: 10/13/2014 03:58 PM   Modules accepted: Orders

## 2014-10-13 NOTE — Assessment & Plan Note (Signed)
Td given by nursing staff. Will begin Doxycycline BID x 7 days.  Pain management and supportive measures discussed.  Follow-up on Monday if improving.  If anything worsens or if fever, chills develop, she is to proceed to ER.

## 2014-10-15 ENCOUNTER — Ambulatory Visit (HOSPITAL_BASED_OUTPATIENT_CLINIC_OR_DEPARTMENT_OTHER)
Admission: RE | Admit: 2014-10-15 | Discharge: 2014-10-15 | Disposition: A | Discharge status: Home / Self Care | Payer: Medicare Other | Source: Ambulatory Visit | Attending: Medical | Admitting: Medical

## 2014-10-15 ENCOUNTER — Ambulatory Visit (INDEPENDENT_AMBULATORY_CARE_PROVIDER_SITE_OTHER): Payer: Medicare Other | Admitting: Medical

## 2014-10-15 ENCOUNTER — Encounter: Payer: Self-pay | Admitting: Medical

## 2014-10-15 ENCOUNTER — Telehealth: Payer: Self-pay | Admitting: Medical

## 2014-10-15 VITALS — BP 111/69 | HR 58 | Temp 98.7°F | Ht 61.5 in | Wt 135.0 lb

## 2014-10-15 DIAGNOSIS — M795 Residual foreign body in soft tissue: Secondary | ICD-10-CM

## 2014-10-15 DIAGNOSIS — M25572 Pain in left ankle and joints of left foot: Secondary | ICD-10-CM

## 2014-10-15 DIAGNOSIS — L089 Local infection of the skin and subcutaneous tissue, unspecified: Secondary | ICD-10-CM | POA: Insufficient documentation

## 2014-10-15 NOTE — Assessment & Plan Note (Signed)
In the foot/sewing needle found by xray. Put in referral to podiatrist. Try to get her in by tomorrow or Monday at the latest. Pt daughter notified of fb and plan by phone.

## 2014-10-15 NOTE — Assessment & Plan Note (Signed)
Skin infection post puncture wound left medial foot. Cause of small puncture/abrasion unclear but pt states stepping on stable may have caused. So will get xray to r/o fb.  The area looks mild infected but by report appears to be incrementally improved and making progress in positive direction.  If area of redness expands,fevers, chills, or area swells as if needs I and D then UC evaluation over weekend.  Otherwise follow up on Monday or Tuesday.  Continue doxycycline.

## 2014-10-15 NOTE — Progress Notes (Signed)
Pre visit review using our clinic review tool, if applicable. No additional management support is needed unless otherwise documented below in the visit note. 

## 2014-10-15 NOTE — Patient Instructions (Addendum)
Skin infection Skin infection post puncture wound left medial foot. Cause of small puncture/abrasion unclear but pt states stepping on stable may have caused. So will get xray to r/o fb.  The area looks mild infected but by report appears to be incrementally improved and making progress in positive direction.  If area of redness expands,fevers, chills, or area swells as if needs I and D then UC evaluation over weekend.  Otherwise follow up on Monday or Tuesday.  Continue doxycycline.   Foreign body (FB) in soft tissue In the foot/sewing needle found by xray. Put in referral to podiatrist. Try to get her in by tomorrow or Monday at the latest. Pt daughter notified of fb and plan by phone.

## 2014-10-15 NOTE — Telephone Encounter (Signed)
Lt foot fb. Sewing needle. Will go ahead and try to refer pt to podiatry. See if she can be seen on 19th or at latest 22nd.

## 2014-10-15 NOTE — Progress Notes (Signed)
Subjective:    Patient ID: Sheryl Lang, female    DOB: 20-Jun-1921, 81 y.o.   MRN: [id number]  HPI    Pt in with daughter. By hx per daughter no known definite injury. I did review [place] note.  Pt has small black dot medial aspect. Daughter thinks she saw this yesterday. Pt is on doxycycline. No fever, no chills and no sweats.  Area first irritated on Sunday morning. By Sunday evening little red.  First seen by [facility] on Tuesday.  The area of redness has subsided about 1.5 cm on area of redness per daughter.  Pt mentioned again that she thinks she stepped on a stable.    Review of Systems  Constitutional: Negative for fever, chills and fatigue.  Respiratory: Negative for cough, choking, chest tightness and wheezing.   Cardiovascular: Negative for chest pain and palpitations.  Musculoskeletal:       Lt foot pain- less than before but still painful. Less red than before.  Skin:       Red area to left foot medial asepct.  Neurological: Negative for dizziness, weakness and headaches.  Hematological: Negative for adenopathy. Does not bruise/bleed easily.     Past Medical History  Diagnosis Date  . Osteoporosis   . Hypertension   . Transient memory loss 04/07/2011  . Right knee pain 10/20/2012  . Dermatitis, seborrheic 05/19/2013  . UTI (urinary tract infection) 05/19/2013  . Urinary tract infection, site not specified 05/19/2013  . Medicare annual wellness visit, subsequent 04/05/2014    History   Social History  . Marital Status: Widowed    Spouse Name: N/A  . Number of Children: N/A  . Years of Education: N/A   Occupational History  . Not on file.   Social History Main Topics  . Smoking status: Never Smoker   . Smokeless tobacco: Never Used  . Alcohol Use: Yes  . Drug Use: Not on file  . Sexual Activity: No     Comment: lives at [facility]   Other Topics Concern  . Not on file   Social History Narrative    No past surgical history on file.  Family  History  Problem Relation Age of Onset  . Diabetes      family hx  . Mental illness Brother     suicide  . Diabetes Paternal Grandmother   . Diabetes Paternal Grandfather   . Diabetes Sister     Allergies  Allergen Reactions  . Penicillins     Current Outpatient Prescriptions on File Prior to Visit  Medication Sig Dispense Refill  . acetaminophen (TYLENOL) 500 MG tablet 1 tab po twice daily for pain 30 tablet 0  . atenolol (TENORMIN) 50 MG tablet TAKE 1 TABLET BY MOUTH DAILY 90 tablet 0  . b complex vitamins tablet Take 1 tablet by mouth daily.      . citalopram (CELEXA) 20 MG tablet Take 1 tablet (20 mg total) by mouth daily. 30 tablet 1  . CRANBERRY PO Take 1 tablet by mouth daily.    . diphenhydrAMINE (BENADRYL) 25 mg capsule Take 12.5 mg by mouth at bedtime as needed.     . doxycycline (VIBRAMYCIN) 100 MG capsule Take 1 capsule (100 mg total) by mouth 2 (two) times daily. 14 capsule 0  . KRILL OIL PO Take by mouth.    . Loperamide HCl (IMODIUM PO) Take by mouth as needed.    . naproxen sodium (ANAPROX) 220 MG tablet Take 1 tablet (220  mg total) by mouth daily as needed. SEVERE PAIN, WITH FOOD    . simethicone (MYLICON) 014 MG chewable tablet Chew 125 mg by mouth every 6 (six) hours as needed for flatulence.    . vitamin C (ASCORBIC ACID) 500 MG tablet Take 500 mg by mouth daily.      Marland Kitchen VITAMIN D, CHOLECALCIFEROL, PO Take 1 tablet by mouth daily.      Marland Kitchen zinc gluconate 50 MG tablet Take 50 mg by mouth daily.     No current facility-administered medications on file prior to visit.    BP 111/69 mmHg  Pulse 58  Temp(Src) 98.7 F (37.1 C) (Oral)  Ht 5' 1.5" (1.562 m)  Wt 135 lb (61.236 kg)  BMI 25.10 kg/m2  SpO2 97%       Objective:   Physical Exam  General- No acute distress. Pleasant. Lt foot- medical aspect 1.5 cm area of faint  redness x 1.0cm center has small scab that is tender to palpation.  Lymph node exam- no palpable lymphadenopathy.       Assessment  & Plan:

## 2014-10-16 ENCOUNTER — Encounter: Payer: Self-pay | Admitting: Podiatry

## 2014-10-16 ENCOUNTER — Ambulatory Visit (INDEPENDENT_AMBULATORY_CARE_PROVIDER_SITE_OTHER): Payer: Medicare Other | Admitting: Podiatry

## 2014-10-16 ENCOUNTER — Ambulatory Visit: Payer: 59 | Admitting: Physician Assistant

## 2014-10-16 VITALS — BP 121/66 | HR 65 | Ht 61.5 in | Wt 135.0 lb

## 2014-10-16 DIAGNOSIS — S91332A Puncture wound without foreign body, left foot, initial encounter: Secondary | ICD-10-CM | POA: Diagnosis not present

## 2014-10-16 DIAGNOSIS — L089 Local infection of the skin and subcutaneous tissue, unspecified: Secondary | ICD-10-CM | POA: Diagnosis not present

## 2014-10-16 DIAGNOSIS — M795 Residual foreign body in soft tissue: Secondary | ICD-10-CM | POA: Diagnosis not present

## 2014-10-16 MED ORDER — DOXYCYCLINE HYCLATE 100 MG PO CAPS: 100.0000 mg | ORAL_CAPSULE | Freq: Two times a day (BID) | ORAL | Status: DC

## 2014-10-16 NOTE — Progress Notes (Signed)
SUBJECTIVE: 81 y.o. year old female presents accompanied by her daughter with painful left foot that was examined at PCP office and diagnosed for having a foreign body (sawing needing) in soft tissue of the left foot.  The foot has been hurting since last Sunday (10/11/14).   REVIEW OF SYSTEMS: Constitutional: negative, Denies of having fever or chills. Eyes: negative Ears, nose, mouth, throat, and face: negative, Hard of hearing.  Respiratory: negative Cardiovascular: negative Gastrointestinal: negative Genitourinary:Has over reactive bladder. Hematologic/lymphatic: negative Musculoskeletal:Arthritic knee, trigger finger proble, and a minor back ache.  Neurological: negative Allergic/Immunologic: Childhood reaction with penicillin.   OBJECTIVE: DERMATOLOGIC EXAMINATION: Mild erythema at medial aspect left mid foot with a focalized blood blister on surface.  VASCULAR EXAMINATION OF LOWER LIMBS: Pedal pulses are not palpable on both feet.  Mild edema on left foot with midfoot erythema.  NEUROLOGIC EXAMINATION OF THE LOWER LIMBS: All epicritic and tactile sensations are grossly intact.  MUSCULOSKELETAL EXAMINATION: No gross deformities noted.   RADIOGRAPHIC STUDIES:  Left foot shoe imbedded needle like object at midfoot soft tissue running along the direction of the bones.  ASSESSMENT: Foreign body with inflamed soft tissue left foot.  PLAN: Blood blister lanced out and drained. Betadine dressing applied. The object was not identifiable with surface probe.  Will schedule for surgical removal as soon as surgery center schedule allow. Reviewed the findings with patient and her daughter.

## 2014-10-19 ENCOUNTER — Telehealth: Payer: Self-pay | Admitting: *Deleted

## 2014-10-19 ENCOUNTER — Telehealth: Payer: Self-pay | Admitting: Family Medicine

## 2014-10-19 ENCOUNTER — Ambulatory Visit: Payer: 59 | Admitting: Physician Assistant

## 2014-10-19 NOTE — Telephone Encounter (Signed)
Info faxed successfully. JG//CMA

## 2014-10-19 NOTE — Telephone Encounter (Signed)
PLEASE SEND MOST RECENT HISTORY AND PHYSICAL TO CHRISTINE CRANDALL AT [place]

## 2014-10-19 NOTE — Telephone Encounter (Signed)
Completed form faxed to [facility] at [phone] successfully. JG//CMA

## 2014-10-19 NOTE — Telephone Encounter (Signed)
10/20/14 Dr.Sheard, Patients Daughter called this morning and she said she forgot yo ask you about anestheia , where her Mother was going to be put all the way out. She states she would rather she be lightly sedated and not all the way out because of her age.I told her I would ask you and call her back this afternoon.

## 2014-10-19 NOTE — Telephone Encounter (Signed)
Caller name: Salley Scarlet Relation to pt: self  Call back number:[phone] Pharmacy:  Reason for call:  Daughter is checking the status of FL2 form for [facility] that was faxed last week

## 2014-10-20 DIAGNOSIS — M795 Residual foreign body in soft tissue: Secondary | ICD-10-CM

## 2014-10-20 DIAGNOSIS — T8484XA Pain due to internal orthopedic prosthetic devices, implants and grafts, initial encounter: Secondary | ICD-10-CM | POA: Diagnosis not present

## 2014-10-20 HISTORY — PX: OTHER SURGICAL HISTORY: SHX169

## 2014-10-21 ENCOUNTER — Telehealth: Payer: Self-pay | Admitting: *Deleted

## 2014-10-21 DIAGNOSIS — D649 Anemia, unspecified: Secondary | ICD-10-CM | POA: Diagnosis not present

## 2014-10-21 DIAGNOSIS — G8929 Other chronic pain: Secondary | ICD-10-CM | POA: Diagnosis not present

## 2014-10-21 DIAGNOSIS — F329 Major depressive disorder, single episode, unspecified: Secondary | ICD-10-CM | POA: Diagnosis not present

## 2014-10-21 DIAGNOSIS — I10 Essential (primary) hypertension: Secondary | ICD-10-CM | POA: Diagnosis not present

## 2014-10-21 DIAGNOSIS — N150 Balkan nephropathy: Secondary | ICD-10-CM | POA: Diagnosis not present

## 2014-10-21 DIAGNOSIS — E559 Vitamin D deficiency, unspecified: Secondary | ICD-10-CM | POA: Diagnosis not present

## 2014-10-21 NOTE — Telephone Encounter (Signed)
10/21/14 Dr. Caffie Pinto, This afternoon, call from [facility] @ Patient from [place], [phone] she states Ms. Yokum seems to think she should be on a antibiotic but they have no record. I told her I would call her back after I talked with you.

## 2014-10-26 ENCOUNTER — Encounter: Payer: Self-pay | Admitting: Podiatry

## 2014-10-26 ENCOUNTER — Ambulatory Visit (INDEPENDENT_AMBULATORY_CARE_PROVIDER_SITE_OTHER): Payer: Medicare Other | Admitting: Podiatry

## 2014-10-26 VITALS — BP 151/78 | HR 60

## 2014-10-26 DIAGNOSIS — M795 Residual foreign body in soft tissue: Secondary | ICD-10-CM

## 2014-10-26 NOTE — Progress Notes (Signed)
81 year old female presents accompanied by her daughter.  6 days post op following foreign body removal(sewing needle). No complaints. Surgical site is dry and clean. No edema or erythema noted. Betadine dressing changed. Take antibiotics till Tuesday that will make total 14 days.  Return this Friday to remove suture.

## 2014-10-26 NOTE — Patient Instructions (Signed)
6 days post op wound. Doing well. Return in 5 days to remove sutures.

## 2014-10-30 ENCOUNTER — Encounter: Payer: Self-pay | Admitting: Podiatry

## 2014-10-30 ENCOUNTER — Ambulatory Visit (INDEPENDENT_AMBULATORY_CARE_PROVIDER_SITE_OTHER): Payer: Medicare Other | Admitting: Podiatry

## 2014-10-30 DIAGNOSIS — M795 Residual foreign body in soft tissue: Secondary | ICD-10-CM

## 2014-10-30 NOTE — Patient Instructions (Signed)
Post op wound healing well. Suture removed. May wear regular shoes as tolerated.Keep the tape till it comes off. May take shower but no bath for another week. Return in 2 weeks.

## 2014-10-30 NOTE — Progress Notes (Signed)
Post op wound. Denies discomfort. Wound healing good without edema or erythema. Old inflammation is fading away. Suture removed and Mefix tape placed. Home care instruction given to her and her daughter. Return in 2 weeks.

## 2014-11-10 ENCOUNTER — Ambulatory Visit (INDEPENDENT_AMBULATORY_CARE_PROVIDER_SITE_OTHER): Payer: Medicare Other | Admitting: Family Medicine

## 2014-11-10 ENCOUNTER — Encounter: Payer: Self-pay | Admitting: Family Medicine

## 2014-11-10 VITALS — BP 120/72 | HR 71 | Temp 98.4°F | Ht 62.0 in | Wt 133.2 lb

## 2014-11-10 DIAGNOSIS — K4020 Bilateral inguinal hernia, without obstruction or gangrene, not specified as recurrent: Secondary | ICD-10-CM | POA: Diagnosis not present

## 2014-11-10 DIAGNOSIS — I10 Essential (primary) hypertension: Secondary | ICD-10-CM | POA: Diagnosis not present

## 2014-11-10 DIAGNOSIS — K529 Noninfective gastroenteritis and colitis, unspecified: Secondary | ICD-10-CM

## 2014-11-10 DIAGNOSIS — K649 Unspecified hemorrhoids: Secondary | ICD-10-CM

## 2014-11-10 DIAGNOSIS — M795 Residual foreign body in soft tissue: Secondary | ICD-10-CM | POA: Diagnosis not present

## 2014-11-10 MED ORDER — ATENOLOL 50 MG PO TABS: 50.0000 mg | ORAL_TABLET | Freq: Every day | ORAL | Status: DC

## 2014-11-10 MED ORDER — CITALOPRAM HYDROBROMIDE 20 MG PO TABS: 20.0000 mg | ORAL_TABLET | Freq: Every day | ORAL | Status: DC

## 2014-11-10 MED ORDER — HYDROCORTISONE ACETATE 25 MG RE SUPP: 25.0000 mg | Freq: Every evening | RECTAL | Status: AC | PRN

## 2014-11-10 NOTE — Progress Notes (Signed)
Pre visit review using our clinic review tool, if applicable. No additional management support is needed unless otherwise documented below in the visit note. 

## 2014-11-10 NOTE — Patient Instructions (Signed)
Stop the Gas X unless you have gas, Continue the probiotic and the Benefiber   Hemorrhoids Hemorrhoids are swollen veins around the rectum or anus. There are two types of hemorrhoids:   Internal hemorrhoids. These occur in the veins just inside the rectum. They may poke through to the outside and become irritated and painful.  External hemorrhoids. These occur in the veins outside the anus and can be felt as a painful swelling or hard lump near the anus. CAUSES  Pregnancy.   Obesity.   Constipation or diarrhea.   Straining to have a bowel movement.   Sitting for long periods on the toilet.  Heavy lifting or other activity that caused you to strain.  Anal intercourse. SYMPTOMS   Pain.   Anal itching or irritation.   Rectal bleeding.   Fecal leakage.   Anal swelling.   One or more lumps around the anus.  DIAGNOSIS  Your caregiver may be able to diagnose hemorrhoids by visual examination. Other examinations or tests that may be performed include:   Examination of the rectal area with a gloved hand (digital rectal exam).   Examination of anal canal using a small tube (scope).   A blood test if you have lost a significant amount of blood.  A test to look inside the colon (sigmoidoscopy or colonoscopy). TREATMENT Most hemorrhoids can be treated at home. However, if symptoms do not seem to be getting better or if you have a lot of rectal bleeding, your caregiver may perform a procedure to help make the hemorrhoids get smaller or remove them completely. Possible treatments include:   Placing a rubber band at the base of the hemorrhoid to cut off the circulation (rubber band ligation).   Injecting a chemical to shrink the hemorrhoid (sclerotherapy).   Using a tool to burn the hemorrhoid (infrared light therapy).   Surgically removing the hemorrhoid (hemorrhoidectomy).   Stapling the hemorrhoid to block blood flow to the tissue (hemorrhoid stapling).   HOME CARE INSTRUCTIONS   Eat foods with fiber, such as whole grains, beans, nuts, fruits, and vegetables. Ask your doctor about taking products with added fiber in them (fibersupplements).  Increase fluid intake. Drink enough water and fluids to keep your urine clear or pale yellow.   Exercise regularly.   Go to the bathroom when you have the urge to have a bowel movement. Do not wait.   Avoid straining to have bowel movements.   Keep the anal area dry and clean. Use wet toilet paper or moist towelettes after a bowel movement.   Medicated creams and suppositories may be used or applied as directed.   Only take over-the-counter or prescription medicines as directed by your caregiver.   Take warm sitz baths for 15-20 minutes, 3-4 times a day to ease pain and discomfort.   Place ice packs on the hemorrhoids if they are tender and swollen. Using ice packs between sitz baths may be helpful.   Put ice in a plastic bag.   Place a towel between your skin and the bag.   Leave the ice on for 15-20 minutes, 3-4 times a day.   Do not use a donut-shaped pillow or sit on the toilet for long periods. This increases blood pooling and pain.  SEEK MEDICAL CARE IF:  You have increasing pain and swelling that is not controlled by treatment or medicine.  You have uncontrolled bleeding.  You have difficulty or you are unable to have a bowel movement.  You have  pain or inflammation outside the area of the hemorrhoids. MAKE SURE YOU:  Understand these instructions.  Will watch your condition.  Will get help right away if you are not doing well or get worse. Document Released: 08/11/2000 Document Revised: 07/31/2012 Document Reviewed: 06/18/2012 [facility] Patient Information 2015 [place], [place]. This information is not intended to replace advice given to you by your health care provider. Make sure you discuss any questions you have with your health care provider.

## 2014-11-13 ENCOUNTER — Encounter: Payer: Self-pay | Admitting: Podiatry

## 2014-11-13 ENCOUNTER — Ambulatory Visit (INDEPENDENT_AMBULATORY_CARE_PROVIDER_SITE_OTHER): Payer: Medicare Other | Admitting: Podiatry

## 2014-11-13 DIAGNOSIS — Z9889 Other specified postprocedural states: Secondary | ICD-10-CM

## 2014-11-13 NOTE — Progress Notes (Signed)
Post op follow up since foreign body removal from right foot. Wound has healed well. Minimum pain. No abnormal findings from surgical site. Discharged.

## 2014-11-13 NOTE — Patient Instructions (Signed)
Post op wound healed well. May use band aid as needed.

## 2014-11-15 ENCOUNTER — Encounter: Payer: Self-pay | Admitting: Family Medicine

## 2014-11-15 DIAGNOSIS — K649 Unspecified hemorrhoids: Secondary | ICD-10-CM

## 2014-11-15 HISTORY — DX: Unspecified hemorrhoids: K64.9

## 2014-11-15 NOTE — Progress Notes (Signed)
Sheryl Lang  [id number] Apr 12, 1921 11/15/2014      Progress Note-Follow Up  Subjective  Chief Complaint  Chief Complaint  Patient presents with  . Follow-up    HPI  Patient is a 81 y.o. female in today for routine medical care. Patient is in today with family who reports she is doing well. She's recently had a sewing needle removed from her foot but is healing well. He denies any fevers or chills. No discharge or persistent pain. Has been following with podiatry. Continues to struggle with loose stool frequently. Complains of some recent hemorrhoids has used suppositories in the past with good results but does not have any available to her at this time. No recent fevers or chills. No increase in back pain. No new or acute concerns. Denies CP/palp/SOB/HA/congestion/fevers/GI or GU c/o. Taking meds as prescribed. Continues to struggle with memory loss but they report her depression is improved somewhat  Past Medical History  Diagnosis Date  . Osteoporosis   . Hypertension   . Transient memory loss 04/07/2011  . Right knee pain 10/20/2012  . Dermatitis, seborrheic 05/19/2013  . UTI (urinary tract infection) 05/19/2013  . Urinary tract infection, site not specified 05/19/2013  . Medicare annual wellness visit, subsequent 04/05/2014  . Hemorrhoids 11/15/2014    Past Surgical History  Procedure Laterality Date  . Foeign body removal Left 10/20/2014    sewing needle removed from left foot    Family History  Problem Relation Age of Onset  . Diabetes      family hx  . Mental illness Brother     suicide  . Diabetes Paternal Grandmother   . Diabetes Paternal Grandfather   . Diabetes Sister     History   Social History  . Marital Status: Widowed    Spouse Name: N/A  . Number of Children: N/A  . Years of Education: N/A   Occupational History  . Not on file.   Social History Main Topics  . Smoking status: Never Smoker   . Smokeless tobacco: Never Used  . Alcohol Use: 0.0  oz/week    0 Standard drinks or equivalent per week  . Drug Use: Not on file  . Sexual Activity: No     Comment: lives at [facility]   Other Topics Concern  . Not on file   Social History Narrative    Current Outpatient Prescriptions on File Prior to Visit  Medication Sig Dispense Refill  . acetaminophen (TYLENOL) 500 MG tablet 1 tab po twice daily for pain 30 tablet 0  . b complex vitamins tablet Take 1 tablet by mouth daily.      Marland Kitchen CRANBERRY PO Take 1 tablet by mouth daily.    Marland Kitchen KRILL OIL PO Take by mouth.    . Loperamide HCl (IMODIUM PO) Take by mouth as needed.    . simethicone (MYLICON) 542 MG chewable tablet Chew 125 mg by mouth every 6 (six) hours as needed for flatulence.    . vitamin C (ASCORBIC ACID) 500 MG tablet Take 500 mg by mouth daily.      Marland Kitchen VITAMIN D, CHOLECALCIFEROL, PO Take 1 tablet by mouth daily.      Marland Kitchen zinc gluconate 50 MG tablet Take 50 mg by mouth daily.     No current facility-administered medications on file prior to visit.    Allergies  Allergen Reactions  . Penicillins     Review of Systems  Review of Systems  Constitutional: Negative for fever  and malaise/fatigue.  HENT: Negative for congestion.   Eyes: Negative for discharge.  Respiratory: Negative for shortness of breath.   Cardiovascular: Negative for chest pain, palpitations and leg swelling.  Gastrointestinal: Negative for nausea, abdominal pain and diarrhea.  Genitourinary: Negative for dysuria.  Musculoskeletal: Negative for falls.  Skin: Negative for rash.  Neurological: Negative for loss of consciousness and headaches.  Endo/Heme/Allergies: Negative for polydipsia.  Psychiatric/Behavioral: Negative for depression and suicidal ideas. The patient is not nervous/anxious and does not have insomnia.     Objective  BP 120/72 mmHg  Pulse 71  Temp(Src) 98.4 F (36.9 C) (Oral)  Ht 5\' 2"  (1.575 m)  Wt 133 lb 4 oz (60.442 kg)  BMI 24.37 kg/m2  SpO2 92%  Physical  Exam  Physical Exam  Constitutional: She is well-developed, well-nourished, and in no distress. No distress.  HENT:  Head: Normocephalic and atraumatic.  Right Ear: External ear normal.  Left Ear: External ear normal.  Mouth/Throat: Oropharynx is clear and moist.  Eyes: Conjunctivae are normal. Right eye exhibits no discharge. Left eye exhibits no discharge. No scleral icterus.  Neck: Normal range of motion. Neck supple. No thyromegaly present.  Cardiovascular: Normal rate, regular rhythm, normal heart sounds and intact distal pulses.   Pulmonary/Chest: Effort normal and breath sounds normal. No respiratory distress. She has no wheezes. She has no rales.  Abdominal: Soft. Bowel sounds are normal. She exhibits no distension and no mass. There is no tenderness.  Musculoskeletal: Normal range of motion. She exhibits no edema or tenderness.  Lymphadenopathy:    She has no cervical adenopathy.  Neurological: She is alert. She has normal reflexes. Coordination normal.  Skin: Skin is warm and dry. No rash noted. She is not diaphoretic.  Psychiatric: Mood and affect normal.    Lab Results  Component Value Date   TSH 1.385 04/02/2014   Lab Results  Component Value Date   WBC 7.0 04/02/2014   HGB 12.3 04/02/2014   HCT 36.3 04/02/2014   MCV 82.3 04/02/2014   PLT 242 04/02/2014   Lab Results  Component Value Date   CREATININE 0.75 04/02/2014   BUN 23 04/02/2014   NA 139 04/02/2014   K 4.3 04/02/2014   CL 101 04/02/2014   CO2 30 04/02/2014   Lab Results  Component Value Date   ALT 15 04/02/2014   AST 21 04/02/2014   ALKPHOS 91 04/02/2014   BILITOT 0.6 04/02/2014   No results found for: CHOL No results found for: HDL No results found for: LDLCALC No results found for: TRIG No results found for: CHOLHDL   Assessment & Plan  Essential hypertension Well controlled, no changes to meds. Encouraged heart healthy diet such as the DASH diet and exercise as tolerated.     Bilateral inguinal hernia Asymptomatic, discouraged from pursuing consultation unless it becomes symptomatic, needs to keep stool soft and moving   Foreign body (FB) in soft tissue recently had to have a needle is healing well   Hypercalcemia Resolved    Chronic diarrhea Encouraged daily or even bid Benefiber, avoid offending foods and take probiotics daily   Hemorrhoids Given rx for Anusol HC suppositories to use prn and encouraged fiber and probiotics

## 2014-11-15 NOTE — Assessment & Plan Note (Signed)
Asymptomatic, discouraged from pursuing consultation unless it becomes symptomatic, needs to keep stool soft and moving

## 2014-11-15 NOTE — Assessment & Plan Note (Signed)
Encouraged daily or even bid Benefiber, avoid offending foods and take probiotics daily

## 2014-11-15 NOTE — Assessment & Plan Note (Signed)
Well controlled, no changes to meds. Encouraged heart healthy diet such as the DASH diet and exercise as tolerated.  °

## 2014-11-15 NOTE — Assessment & Plan Note (Signed)
Given rx for Anusol HC suppositories to use prn and encouraged fiber and probiotics

## 2014-11-15 NOTE — Assessment & Plan Note (Signed)
recently had to have a needle is healing well

## 2014-11-15 NOTE — Assessment & Plan Note (Signed)
Resolved

## 2014-11-30 DIAGNOSIS — M66241 Spontaneous rupture of extensor tendons, right hand: Secondary | ICD-10-CM | POA: Diagnosis not present

## 2014-12-04 DIAGNOSIS — M79675 Pain in left toe(s): Secondary | ICD-10-CM | POA: Diagnosis not present

## 2014-12-04 DIAGNOSIS — B351 Tinea unguium: Secondary | ICD-10-CM | POA: Diagnosis not present

## 2014-12-04 DIAGNOSIS — M79672 Pain in left foot: Secondary | ICD-10-CM | POA: Diagnosis not present

## 2014-12-28 DIAGNOSIS — M66241 Spontaneous rupture of extensor tendons, right hand: Secondary | ICD-10-CM | POA: Diagnosis not present

## 2015-01-13 DIAGNOSIS — M66241 Spontaneous rupture of extensor tendons, right hand: Secondary | ICD-10-CM | POA: Diagnosis not present

## 2015-01-13 DIAGNOSIS — M24841 Other specific joint derangements of right hand, not elsewhere classified: Secondary | ICD-10-CM | POA: Diagnosis not present

## 2015-01-26 DIAGNOSIS — M66241 Spontaneous rupture of extensor tendons, right hand: Secondary | ICD-10-CM | POA: Diagnosis not present

## 2015-02-05 DIAGNOSIS — M79674 Pain in right toe(s): Secondary | ICD-10-CM | POA: Diagnosis not present

## 2015-02-05 DIAGNOSIS — M79675 Pain in left toe(s): Secondary | ICD-10-CM | POA: Diagnosis not present

## 2015-02-05 DIAGNOSIS — B351 Tinea unguium: Secondary | ICD-10-CM | POA: Diagnosis not present

## 2015-02-09 DIAGNOSIS — M66241 Spontaneous rupture of extensor tendons, right hand: Secondary | ICD-10-CM | POA: Diagnosis not present

## 2015-02-12 DIAGNOSIS — R2681 Unsteadiness on feet: Secondary | ICD-10-CM | POA: Diagnosis not present

## 2015-02-12 DIAGNOSIS — R278 Other lack of coordination: Secondary | ICD-10-CM | POA: Diagnosis not present

## 2015-02-15 DIAGNOSIS — R2681 Unsteadiness on feet: Secondary | ICD-10-CM | POA: Diagnosis not present

## 2015-02-15 DIAGNOSIS — R278 Other lack of coordination: Secondary | ICD-10-CM | POA: Diagnosis not present

## 2015-02-16 DIAGNOSIS — M66241 Spontaneous rupture of extensor tendons, right hand: Secondary | ICD-10-CM | POA: Diagnosis not present

## 2015-02-17 DIAGNOSIS — R278 Other lack of coordination: Secondary | ICD-10-CM | POA: Diagnosis not present

## 2015-02-17 DIAGNOSIS — R2681 Unsteadiness on feet: Secondary | ICD-10-CM | POA: Diagnosis not present

## 2015-02-18 DIAGNOSIS — R2681 Unsteadiness on feet: Secondary | ICD-10-CM | POA: Diagnosis not present

## 2015-02-18 DIAGNOSIS — R278 Other lack of coordination: Secondary | ICD-10-CM | POA: Diagnosis not present

## 2015-02-22 DIAGNOSIS — R278 Other lack of coordination: Secondary | ICD-10-CM | POA: Diagnosis not present

## 2015-02-22 DIAGNOSIS — R2681 Unsteadiness on feet: Secondary | ICD-10-CM | POA: Diagnosis not present

## 2015-02-24 DIAGNOSIS — R278 Other lack of coordination: Secondary | ICD-10-CM | POA: Diagnosis not present

## 2015-02-24 DIAGNOSIS — R2681 Unsteadiness on feet: Secondary | ICD-10-CM | POA: Diagnosis not present

## 2015-02-25 DIAGNOSIS — R2681 Unsteadiness on feet: Secondary | ICD-10-CM | POA: Diagnosis not present

## 2015-02-25 DIAGNOSIS — R278 Other lack of coordination: Secondary | ICD-10-CM | POA: Diagnosis not present

## 2015-03-01 DIAGNOSIS — R278 Other lack of coordination: Secondary | ICD-10-CM | POA: Diagnosis not present

## 2015-03-01 DIAGNOSIS — M6281 Muscle weakness (generalized): Secondary | ICD-10-CM | POA: Diagnosis not present

## 2015-03-03 DIAGNOSIS — M6281 Muscle weakness (generalized): Secondary | ICD-10-CM | POA: Diagnosis not present

## 2015-03-03 DIAGNOSIS — R278 Other lack of coordination: Secondary | ICD-10-CM | POA: Diagnosis not present

## 2015-03-04 DIAGNOSIS — R278 Other lack of coordination: Secondary | ICD-10-CM | POA: Diagnosis not present

## 2015-03-04 DIAGNOSIS — M6281 Muscle weakness (generalized): Secondary | ICD-10-CM | POA: Diagnosis not present

## 2015-03-09 DIAGNOSIS — Z4789 Encounter for other orthopedic aftercare: Secondary | ICD-10-CM | POA: Diagnosis not present

## 2015-03-09 DIAGNOSIS — M66241 Spontaneous rupture of extensor tendons, right hand: Secondary | ICD-10-CM | POA: Diagnosis not present

## 2015-03-12 DIAGNOSIS — M6281 Muscle weakness (generalized): Secondary | ICD-10-CM | POA: Diagnosis not present

## 2015-03-12 DIAGNOSIS — R278 Other lack of coordination: Secondary | ICD-10-CM | POA: Diagnosis not present

## 2015-04-09 DIAGNOSIS — M79674 Pain in right toe(s): Secondary | ICD-10-CM | POA: Diagnosis not present

## 2015-04-09 DIAGNOSIS — B351 Tinea unguium: Secondary | ICD-10-CM | POA: Diagnosis not present

## 2015-04-19 ENCOUNTER — Telehealth: Payer: Self-pay | Admitting: Family Medicine

## 2015-04-19 NOTE — Telephone Encounter (Signed)
pre visit letter mailed 04/19/15

## 2015-04-22 DIAGNOSIS — M1711 Unilateral primary osteoarthritis, right knee: Secondary | ICD-10-CM | POA: Diagnosis not present

## 2015-04-22 DIAGNOSIS — M25561 Pain in right knee: Secondary | ICD-10-CM | POA: Diagnosis not present

## 2015-04-28 DIAGNOSIS — H524 Presbyopia: Secondary | ICD-10-CM | POA: Diagnosis not present

## 2015-04-28 DIAGNOSIS — H40023 Open angle with borderline findings, high risk, bilateral: Secondary | ICD-10-CM | POA: Diagnosis not present

## 2015-04-28 DIAGNOSIS — H3532 Exudative age-related macular degeneration: Secondary | ICD-10-CM | POA: Diagnosis not present

## 2015-04-29 DIAGNOSIS — R2689 Other abnormalities of gait and mobility: Secondary | ICD-10-CM | POA: Diagnosis not present

## 2015-04-29 DIAGNOSIS — M6281 Muscle weakness (generalized): Secondary | ICD-10-CM | POA: Diagnosis not present

## 2015-05-06 ENCOUNTER — Telehealth: Payer: Self-pay

## 2015-05-06 NOTE — Telephone Encounter (Signed)
Pre visit call made 05/06/15

## 2015-05-10 ENCOUNTER — Ambulatory Visit (INDEPENDENT_AMBULATORY_CARE_PROVIDER_SITE_OTHER): Payer: Medicare Other | Admitting: Family Medicine

## 2015-05-10 ENCOUNTER — Encounter: Payer: Self-pay | Admitting: Family Medicine

## 2015-05-10 VITALS — BP 110/56 | HR 63 | Temp 98.1°F | Resp 18 | Ht 62.0 in | Wt 130.8 lb

## 2015-05-10 DIAGNOSIS — Z0000 Encounter for general adult medical examination without abnormal findings: Secondary | ICD-10-CM | POA: Diagnosis not present

## 2015-05-10 DIAGNOSIS — M25561 Pain in right knee: Secondary | ICD-10-CM | POA: Diagnosis not present

## 2015-05-10 DIAGNOSIS — N390 Urinary tract infection, site not specified: Secondary | ICD-10-CM

## 2015-05-10 DIAGNOSIS — Z9889 Other specified postprocedural states: Secondary | ICD-10-CM

## 2015-05-10 DIAGNOSIS — I10 Essential (primary) hypertension: Secondary | ICD-10-CM | POA: Diagnosis not present

## 2015-05-10 DIAGNOSIS — Z23 Encounter for immunization: Secondary | ICD-10-CM | POA: Diagnosis not present

## 2015-05-10 NOTE — Progress Notes (Signed)
Pre visit review using our clinic review tool, if applicable. No additional management support is needed unless otherwise documented below in the visit note. 

## 2015-05-10 NOTE — Patient Instructions (Signed)
Probiotic daily such as [employer] or [employer] or [place] 10 strain caps daily at [email or web address]  Preventive Care for Adults A healthy lifestyle and preventive care can promote health and wellness. Preventive health guidelines for women include the following key practices.  A routine yearly physical is a good way to check with your health care provider about your health and preventive screening. It is a chance to share any concerns and updates on your health and to receive a thorough exam.  Visit your dentist for a routine exam and preventive care every 6 months. Brush your teeth twice a day and floss once a day. Good oral hygiene prevents tooth decay and gum disease.  The frequency of eye exams is based on your age, health, family medical history, use of contact lenses, and other factors. Follow your health care provider's recommendations for frequency of eye exams.  Eat a healthy diet. Foods like vegetables, fruits, whole grains, low-fat dairy products, and lean protein foods contain the nutrients you need without too many calories. Decrease your intake of foods high in solid fats, added sugars, and salt. Eat the right amount of calories for you.Get information about a proper diet from your health care provider, if necessary.  Regular physical exercise is one of the most important things you can do for your health. Most adults should get at least 150 minutes of moderate-intensity exercise (any activity that increases your heart rate and causes you to sweat) each week. In addition, most adults need muscle-strengthening exercises on 2 or more days a week.  Maintain a healthy weight. The body mass index (BMI) is a screening tool to identify possible weight problems. It provides an estimate of body fat based on height and weight. Your health care provider can find your BMI and can help you achieve or maintain a healthy weight.For adults 20 years and older:  A BMI below  18.5 is considered underweight.  A BMI of 18.5 to 24.9 is normal.  A BMI of 25 to 29.9 is considered overweight.  A BMI of 30 and above is considered obese.  Maintain normal blood lipids and cholesterol levels by exercising and minimizing your intake of saturated fat. Eat a balanced diet with plenty of fruit and vegetables. Blood tests for lipids and cholesterol should begin at age 55 and be repeated every 5 years. If your lipid or cholesterol levels are high, you are over 50, or you are at high risk for heart disease, you may need your cholesterol levels checked more frequently.Ongoing high lipid and cholesterol levels should be treated with medicines if diet and exercise are not working.  If you smoke, find out from your health care provider how to quit. If you do not use tobacco, do not start.  Lung cancer screening is recommended for adults aged 37-80 years who are at high risk for developing lung cancer because of a history of smoking. A yearly low-dose CT scan of the lungs is recommended for people who have at least a 30-pack-year history of smoking and are a current smoker or have quit within the past 15 years. A pack year of smoking is smoking an average of 1 pack of cigarettes a day for 1 year (for example: 1 pack a day for 30 years or 2 packs a day for 15 years). Yearly screening should continue until the smoker has stopped smoking for at least 15 years. Yearly screening should be stopped for people who develop a health problem that would  prevent them from having lung cancer treatment.  If you are pregnant, do not drink alcohol. If you are breastfeeding, be very cautious about drinking alcohol. If you are not pregnant and choose to drink alcohol, do not have more than 1 drink per day. One drink is considered to be 12 ounces (355 mL) of beer, 5 ounces (148 mL) of wine, or 1.5 ounces (44 mL) of liquor.  Avoid use of street drugs. Do not share needles with anyone. Ask for help if you need  support or instructions about stopping the use of drugs.  High blood pressure causes heart disease and increases the risk of stroke. Your blood pressure should be checked at least every 1 to 2 years. Ongoing high blood pressure should be treated with medicines if weight loss and exercise do not work.  If you are 70-41 years old, ask your health care provider if you should take aspirin to prevent strokes.  Diabetes screening involves taking a blood sample to check your fasting blood sugar level. This should be done once every 3 years, after age 72, if you are within normal weight and without risk factors for diabetes. Testing should be considered at a younger age or be carried out more frequently if you are overweight and have at least 1 risk factor for diabetes.  Breast cancer screening is essential preventive care for women. You should practice "breast self-awareness." This means understanding the normal appearance and feel of your breasts and may include breast self-examination. Any changes detected, no matter how small, should be reported to a health care provider. Women in their 13s and 30s should have a clinical breast exam (CBE) by a health care provider as part of a regular health exam every 1 to 3 years. After age 57, women should have a CBE every year. Starting at age 65, women should consider having a mammogram (breast X-ray test) every year. Women who have a family history of breast cancer should talk to their health care provider about genetic screening. Women at a high risk of breast cancer should talk to their health care providers about having an MRI and a mammogram every year.  Breast cancer gene (BRCA)-related cancer risk assessment is recommended for women who have family members with BRCA-related cancers. BRCA-related cancers include breast, ovarian, tubal, and peritoneal cancers. Having family members with these cancers may be associated with an increased risk for harmful changes  (mutations) in the breast cancer genes BRCA1 and BRCA2. Results of the assessment will determine the need for genetic counseling and BRCA1 and BRCA2 testing.  Routine pelvic exams to screen for cancer are no longer recommended for nonpregnant women who are considered low risk for cancer of the pelvic organs (ovaries, uterus, and vagina) and who do not have symptoms. Ask your health care provider if a screening pelvic exam is right for you.  If you have had past treatment for cervical cancer or a condition that could lead to cancer, you need Pap tests and screening for cancer for at least 20 years after your treatment. If Pap tests have been discontinued, your risk factors (such as having a new sexual partner) need to be reassessed to determine if screening should be resumed. Some women have medical problems that increase the chance of getting cervical cancer. In these cases, your health care provider may recommend more frequent screening and Pap tests.  The HPV test is an additional test that may be used for cervical cancer screening. The HPV test looks for  the virus that can cause the cell changes on the cervix. The cells collected during the Pap test can be tested for HPV. The HPV test could be used to screen women aged 92 years and older, and should be used in women of any age who have unclear Pap test results. After the age of 94, women should have HPV testing at the same frequency as a Pap test.  Colorectal cancer can be detected and often prevented. Most routine colorectal cancer screening begins at the age of 14 years and continues through age 77 years. However, your health care provider may recommend screening at an earlier age if you have risk factors for colon cancer. On a yearly basis, your health care provider may provide home test kits to check for hidden blood in the stool. Use of a small camera at the end of a tube, to directly examine the colon (sigmoidoscopy or colonoscopy), can detect the  earliest forms of colorectal cancer. Talk to your health care provider about this at age 45, when routine screening begins. Direct exam of the colon should be repeated every 5-10 years through age 81 years, unless early forms of pre-cancerous polyps or small growths are found.  People who are at an increased risk for hepatitis B should be screened for this virus. You are considered at high risk for hepatitis B if:  You were born in a country where hepatitis B occurs often. Talk with your health care provider about which countries are considered high risk.  Your parents were born in a high-risk country and you have not received a shot to protect against hepatitis B (hepatitis B vaccine).  You have HIV or AIDS.  You use needles to inject street drugs.  You live with, or have sex with, someone who has hepatitis B.  You get hemodialysis treatment.  You take certain medicines for conditions like cancer, organ transplantation, and autoimmune conditions.  Hepatitis C blood testing is recommended for all people born from [birth year] through 1965 and any individual with known risks for hepatitis C.  Practice safe sex. Use condoms and avoid high-risk sexual practices to reduce the spread of sexually transmitted infections (STIs). STIs include gonorrhea, chlamydia, syphilis, trichomonas, herpes, HPV, and human immunodeficiency virus (HIV). Herpes, HIV, and HPV are viral illnesses that have no cure. They can result in disability, cancer, and death.  You should be screened for sexually transmitted illnesses (STIs) including gonorrhea and chlamydia if:  You are sexually active and are younger than 24 years.  You are older than 24 years and your health care provider tells you that you are at risk for this type of infection.  Your sexual activity has changed since you were last screened and you are at an increased risk for chlamydia or gonorrhea. Ask your health care provider if you are at risk.  If you are  at risk of being infected with HIV, it is recommended that you take a prescription medicine daily to prevent HIV infection. This is called preexposure prophylaxis (PrEP). You are considered at risk if:  You are a heterosexual woman, are sexually active, and are at increased risk for HIV infection.  You take drugs by injection.  You are sexually active with a partner who has HIV.  Talk with your health care provider about whether you are at high risk of being infected with HIV. If you choose to begin PrEP, you should first be tested for HIV. You should then be tested every 3 months for  as long as you are taking PrEP.  Osteoporosis is a disease in which the bones lose minerals and strength with aging. This can result in serious bone fractures or breaks. The risk of osteoporosis can be identified using a bone density scan. Women ages 83 years and over and women at risk for fractures or osteoporosis should discuss screening with their health care providers. Ask your health care provider whether you should take a calcium supplement or vitamin D to reduce the rate of osteoporosis.  Menopause can be associated with physical symptoms and risks. Hormone replacement therapy is available to decrease symptoms and risks. You should talk to your health care provider about whether hormone replacement therapy is right for you.  Use sunscreen. Apply sunscreen liberally and repeatedly throughout the day. You should seek shade when your shadow is shorter than you. Protect yourself by wearing long sleeves, pants, a wide-brimmed hat, and sunglasses year round, whenever you are outdoors.  Once a month, do a whole body skin exam, using a mirror to look at the skin on your back. Tell your health care provider of new moles, moles that have irregular borders, moles that are larger than a pencil eraser, or moles that have changed in shape or color.  Stay current with required vaccines (immunizations).  Influenza vaccine.  All adults should be immunized every year.  Tetanus, diphtheria, and acellular pertussis (Td, Tdap) vaccine. Pregnant women should receive 1 dose of Tdap vaccine during each pregnancy. The dose should be obtained regardless of the length of time since the last dose. Immunization is preferred during the 27th-36th week of gestation. An adult who has not previously received Tdap or who does not know her vaccine status should receive 1 dose of Tdap. This initial dose should be followed by tetanus and diphtheria toxoids (Td) booster doses every 10 years. Adults with an unknown or incomplete history of completing a 3-dose immunization series with Td-containing vaccines should begin or complete a primary immunization series including a Tdap dose. Adults should receive a Td booster every 10 years.  Varicella vaccine. An adult without evidence of immunity to varicella should receive 2 doses or a second dose if she has previously received 1 dose. Pregnant females who do not have evidence of immunity should receive the first dose after pregnancy. This first dose should be obtained before leaving the health care facility. The second dose should be obtained 4-8 weeks after the first dose.  Human papillomavirus (HPV) vaccine. Females aged 13-26 years who have not received the vaccine previously should obtain the 3-dose series. The vaccine is not recommended for use in pregnant females. However, pregnancy testing is not needed before receiving a dose. If a female is found to be pregnant after receiving a dose, no treatment is needed. In that case, the remaining doses should be delayed until after the pregnancy. Immunization is recommended for any person with an immunocompromised condition through the age of 2 years if she did not get any or all doses earlier. During the 3-dose series, the second dose should be obtained 4-8 weeks after the first dose. The third dose should be obtained 24 weeks after the first dose and 16  weeks after the second dose.  Zoster vaccine. One dose is recommended for adults aged 1 years or older unless certain conditions are present.  Measles, mumps, and rubella (MMR) vaccine. Adults born before [birth year] generally are considered immune to measles and mumps. Adults born in [birth year] or later should have 1 or more  doses of MMR vaccine unless there is a contraindication to the vaccine or there is laboratory evidence of immunity to each of the three diseases. A routine second dose of MMR vaccine should be obtained at least 28 days after the first dose for students attending postsecondary schools, health care workers, or international travelers. People who received inactivated measles vaccine or an unknown type of measles vaccine during 1963-1967 should receive 2 doses of MMR vaccine. People who received inactivated mumps vaccine or an unknown type of mumps vaccine before 1979 and are at high risk for mumps infection should consider immunization with 2 doses of MMR vaccine. For females of childbearing age, rubella immunity should be determined. If there is no evidence of immunity, females who are not pregnant should be vaccinated. If there is no evidence of immunity, females who are pregnant should delay immunization until after pregnancy. Unvaccinated health care workers born before [birth year] who lack laboratory evidence of measles, mumps, or rubella immunity or laboratory confirmation of disease should consider measles and mumps immunization with 2 doses of MMR vaccine or rubella immunization with 1 dose of MMR vaccine.  Pneumococcal 13-valent conjugate (PCV13) vaccine. When indicated, a person who is uncertain of her immunization history and has no record of immunization should receive the PCV13 vaccine. An adult aged 63 years or older who has certain medical conditions and has not been previously immunized should receive 1 dose of PCV13 vaccine. This PCV13 should be followed with a dose of pneumococcal  polysaccharide (PPSV23) vaccine. The PPSV23 vaccine dose should be obtained at least 8 weeks after the dose of PCV13 vaccine. An adult aged 76 years or older who has certain medical conditions and previously received 1 or more doses of PPSV23 vaccine should receive 1 dose of PCV13. The PCV13 vaccine dose should be obtained 1 or more years after the last PPSV23 vaccine dose.  Pneumococcal polysaccharide (PPSV23) vaccine. When PCV13 is also indicated, PCV13 should be obtained first. All adults aged 51 years and older should be immunized. An adult younger than age 41 years who has certain medical conditions should be immunized. Any person who resides in a nursing home or long-term care facility should be immunized. An adult smoker should be immunized. People with an immunocompromised condition and certain other conditions should receive both PCV13 and PPSV23 vaccines. People with human immunodeficiency virus (HIV) infection should be immunized as soon as possible after diagnosis. Immunization during chemotherapy or radiation therapy should be avoided. Routine use of PPSV23 vaccine is not recommended for American Indians, [place] Natives, or people younger than 65 years unless there are medical conditions that require PPSV23 vaccine. When indicated, people who have unknown immunization and have no record of immunization should receive PPSV23 vaccine. One-time revaccination 5 years after the first dose of PPSV23 is recommended for people aged 19-64 years who have chronic kidney failure, nephrotic syndrome, asplenia, or immunocompromised conditions. People who received 1-2 doses of PPSV23 before age 53 years should receive another dose of PPSV23 vaccine at age 24 years or later if at least 5 years have passed since the previous dose. Doses of PPSV23 are not needed for people immunized with PPSV23 at or after age 104 years.  Meningococcal vaccine. Adults with asplenia or persistent complement component deficiencies  should receive 2 doses of quadrivalent meningococcal conjugate (MenACWY-D) vaccine. The doses should be obtained at least 2 months apart. Microbiologists working with certain meningococcal bacteria, [place] recruits, people at risk during an outbreak, and people who travel to  or live in countries with a high rate of meningitis should be immunized. A first-year college student up through age 39 years who is living in a residence hall should receive a dose if she did not receive a dose on or after her 16th birthday. Adults who have certain high-risk conditions should receive one or more doses of vaccine.  Hepatitis A vaccine. Adults who wish to be protected from this disease, have certain high-risk conditions, work with hepatitis A-infected animals, work in hepatitis A research labs, or travel to or work in countries with a high rate of hepatitis A should be immunized. Adults who were previously unvaccinated and who anticipate close contact with an international adoptee during the first 60 days after arrival in the [place] from a country with a high rate of hepatitis A should be immunized.  Hepatitis B vaccine. Adults who wish to be protected from this disease, have certain high-risk conditions, may be exposed to blood or other infectious body fluids, are household contacts or sex partners of hepatitis B positive people, are clients or workers in certain care facilities, or travel to or work in countries with a high rate of hepatitis B should be immunized.  Haemophilus influenzae type b (Hib) vaccine. A previously unvaccinated person with asplenia or sickle cell disease or having a scheduled splenectomy should receive 1 dose of Hib vaccine. Regardless of previous immunization, a recipient of a hematopoietic stem cell transplant should receive a 3-dose series 6-12 months after her successful transplant. Hib vaccine is not recommended for adults with HIV infection. Preventive Services / Frequency Ages 81  to 92 years  Blood pressure check.** / Every 1 to 2 years.  Lipid and cholesterol check.** / Every 5 years beginning at age 30.  Clinical breast exam.** / Every 3 years for women in their 3s and 45s.  BRCA-related cancer risk assessment.** / For women who have family members with a BRCA-related cancer (breast, ovarian, tubal, or peritoneal cancers).  Pap test.** / Every 2 years from ages 78 through 60. Every 3 years starting at age 61 through age 64 or 58 with a history of 3 consecutive normal Pap tests.  HPV screening.** / Every 3 years from ages 64 through ages 20 to 60 with a history of 3 consecutive normal Pap tests.  Hepatitis C blood test.** / For any individual with known risks for hepatitis C.  Skin self-exam. / Monthly.  Influenza vaccine. / Every year.  Tetanus, diphtheria, and acellular pertussis (Tdap, Td) vaccine.** / Consult your health care provider. Pregnant women should receive 1 dose of Tdap vaccine during each pregnancy. 1 dose of Td every 10 years.  Varicella vaccine.** / Consult your health care provider. Pregnant females who do not have evidence of immunity should receive the first dose after pregnancy.  HPV vaccine. / 3 doses over 6 months, if 88 and younger. The vaccine is not recommended for use in pregnant females. However, pregnancy testing is not needed before receiving a dose.  Measles, mumps, rubella (MMR) vaccine.** / You need at least 1 dose of MMR if you were born in 1957 or later. You may also need a 2nd dose. For females of childbearing age, rubella immunity should be determined. If there is no evidence of immunity, females who are not pregnant should be vaccinated. If there is no evidence of immunity, females who are pregnant should delay immunization until after pregnancy.  Pneumococcal 13-valent conjugate (PCV13) vaccine.** / Consult your health care provider.  Pneumococcal polysaccharide (  PPSV23) vaccine.** / 1 to 2 doses if you smoke cigarettes  or if you have certain conditions.  Meningococcal vaccine.** / 1 dose if you are age 97 to 65 years and a Market researcher living in a residence hall, or have one of several medical conditions, you need to get vaccinated against meningococcal disease. You may also need additional booster doses.  Hepatitis A vaccine.** / Consult your health care provider.  Hepatitis B vaccine.** / Consult your health care provider.  Haemophilus influenzae type b (Hib) vaccine.** / Consult your health care provider. Ages 21 to 55 years  Blood pressure check.** / Every 1 to 2 years.  Lipid and cholesterol check.** / Every 5 years beginning at age 34 years.  Lung cancer screening. / Every year if you are aged 26-80 years and have a 30-pack-year history of smoking and currently smoke or have quit within the past 15 years. Yearly screening is stopped once you have quit smoking for at least 15 years or develop a health problem that would prevent you from having lung cancer treatment.  Clinical breast exam.** / Every year after age 46 years.  BRCA-related cancer risk assessment.** / For women who have family members with a BRCA-related cancer (breast, ovarian, tubal, or peritoneal cancers).  Mammogram.** / Every year beginning at age 31 years and continuing for as long as you are in good health. Consult with your health care provider.  Pap test.** / Every 3 years starting at age 74 years through age 56 or 90 years with a history of 3 consecutive normal Pap tests.  HPV screening.** / Every 3 years from ages 68 years through ages 61 to 54 years with a history of 3 consecutive normal Pap tests.  Fecal occult blood test (FOBT) of stool. / Every year beginning at age 71 years and continuing until age 1 years. You may not need to do this test if you get a colonoscopy every 10 years.  Flexible sigmoidoscopy or colonoscopy.** / Every 5 years for a flexible sigmoidoscopy or every 10 years for a colonoscopy  beginning at age 75 years and continuing until age 26 years.  Hepatitis C blood test.** / For all people born from [birth year] through 1963 and any individual with known risks for hepatitis C.  Skin self-exam. / Monthly.  Influenza vaccine. / Every year.  Tetanus, diphtheria, and acellular pertussis (Tdap/Td) vaccine.** / Consult your health care provider. Pregnant women should receive 1 dose of Tdap vaccine during each pregnancy. 1 dose of Td every 10 years.  Varicella vaccine.** / Consult your health care provider. Pregnant females who do not have evidence of immunity should receive the first dose after pregnancy.  Zoster vaccine.** / 1 dose for adults aged 25 years or older.  Measles, mumps, rubella (MMR) vaccine.** / You need at least 1 dose of MMR if you were born in 1955 or later. You may also need a 2nd dose. For females of childbearing age, rubella immunity should be determined. If there is no evidence of immunity, females who are not pregnant should be vaccinated. If there is no evidence of immunity, females who are pregnant should delay immunization until after pregnancy.  Pneumococcal 13-valent conjugate (PCV13) vaccine.** / Consult your health care provider.  Pneumococcal polysaccharide (PPSV23) vaccine.** / 1 to 2 doses if you smoke cigarettes or if you have certain conditions.  Meningococcal vaccine.** / Consult your health care provider.  Hepatitis A vaccine.** / Consult your health care provider.  Hepatitis B vaccine.** /  Consult your health care provider.  Haemophilus influenzae type b (Hib) vaccine.** / Consult your health care provider. Ages 78 years and over  Blood pressure check.** / Every 1 to 2 years.  Lipid and cholesterol check.** / Every 5 years beginning at age 55 years.  Lung cancer screening. / Every year if you are aged 30-80 years and have a 30-pack-year history of smoking and currently smoke or have quit within the past 15 years. Yearly screening is stopped  once you have quit smoking for at least 15 years or develop a health problem that would prevent you from having lung cancer treatment.  Clinical breast exam.** / Every year after age 106 years.  BRCA-related cancer risk assessment.** / For women who have family members with a BRCA-related cancer (breast, ovarian, tubal, or peritoneal cancers).  Mammogram.** / Every year beginning at age 70 years and continuing for as long as you are in good health. Consult with your health care provider.  Pap test.** / Every 3 years starting at age 75 years through age 23 or 75 years with 3 consecutive normal Pap tests. Testing can be stopped between 65 and 70 years with 3 consecutive normal Pap tests and no abnormal Pap or HPV tests in the past 10 years.  HPV screening.** / Every 3 years from ages 10 years through ages 37 or 59 years with a history of 3 consecutive normal Pap tests. Testing can be stopped between 65 and 70 years with 3 consecutive normal Pap tests and no abnormal Pap or HPV tests in the past 10 years.  Fecal occult blood test (FOBT) of stool. / Every year beginning at age 52 years and continuing until age 26 years. You may not need to do this test if you get a colonoscopy every 10 years.  Flexible sigmoidoscopy or colonoscopy.** / Every 5 years for a flexible sigmoidoscopy or every 10 years for a colonoscopy beginning at age 62 years and continuing until age 28 years.  Hepatitis C blood test.** / For all people born from [birth year] through 1963 and any individual with known risks for hepatitis C.  Osteoporosis screening.** / A one-time screening for women ages 56 years and over and women at risk for fractures or osteoporosis.  Skin self-exam. / Monthly.  Influenza vaccine. / Every year.  Tetanus, diphtheria, and acellular pertussis (Tdap/Td) vaccine.** / 1 dose of Td every 10 years.  Varicella vaccine.** / Consult your health care provider.  Zoster vaccine.** / 1 dose for adults aged 22 years  or older.  Pneumococcal 13-valent conjugate (PCV13) vaccine.** / Consult your health care provider.  Pneumococcal polysaccharide (PPSV23) vaccine.** / 1 dose for all adults aged 18 years and older.  Meningococcal vaccine.** / Consult your health care provider.  Hepatitis A vaccine.** / Consult your health care provider.  Hepatitis B vaccine.** / Consult your health care provider.  Haemophilus influenzae type b (Hib) vaccine.** / Consult your health care provider. ** Family history and personal history of risk and conditions may change your health care provider's recommendations. Document Released: 10/10/2001 Document Revised: 12/29/2013 Document Reviewed: 01/09/2011 [facility] Patient Information 2015 [place], [place]. This information is not intended to replace advice given to you by your health care provider. Make sure you discuss any questions you have with your health care provider.

## 2015-05-11 LAB — COMPREHENSIVE METABOLIC PANEL
ALT: 11 U/L (ref 0–35)
AST: 18 U/L (ref 0–37)
Albumin: 4 g/dL (ref 3.5–5.2)
Alkaline Phosphatase: 70 U/L (ref 39–117)
BUN: 26 mg/dL — ABNORMAL HIGH (ref 6–23)
CHLORIDE: 104 meq/L (ref 96–112)
CO2: 31 meq/L (ref 19–32)
Calcium: 9.6 mg/dL (ref 8.4–10.5)
Creatinine, Ser: 0.72 mg/dL (ref 0.40–1.20)
GFR: 80.44 mL/min (ref >60.00)
GLUCOSE: 81 mg/dL (ref 70–99)
POTASSIUM: 4.1 meq/L (ref 3.5–5.1)
Sodium: 143 mEq/L (ref 135–145)
Total Bilirubin: 0.6 mg/dL (ref 0.2–1.2)
Total Protein: 6.8 g/dL (ref 6.0–8.3)

## 2015-05-11 LAB — URINALYSIS, ROUTINE W REFLEX MICROSCOPIC
Bilirubin Urine: NEGATIVE
KETONES UR: NEGATIVE
Nitrite: NEGATIVE
SPECIFIC GRAVITY, URINE: 1.025 (ref 1.000–1.030)
Total Protein, Urine: 30 — AB
Urine Glucose: NEGATIVE
Urobilinogen, UA: 1 (ref 0.0–1.0)
pH: 6 (ref 5.0–8.0)

## 2015-05-11 LAB — URINE CULTURE

## 2015-05-11 LAB — CBC
HCT: 36.1 % (ref 36.0–46.0)
HEMOGLOBIN: 12.1 g/dL (ref 12.0–15.0)
MCHC: 33.7 g/dL (ref 30.0–36.0)
MCV: 85.8 fl (ref 78.0–100.0)
PLATELETS: 207 10*3/uL (ref 150.0–400.0)
RBC: 4.21 Mil/uL (ref 3.87–5.11)
RDW: 15 % (ref 11.5–15.5)
WBC: 5.7 10*3/uL (ref 4.0–10.5)

## 2015-05-11 LAB — TSH: TSH: 1.23 u[IU]/mL (ref 0.35–4.50)

## 2015-05-14 ENCOUNTER — Telehealth: Payer: Self-pay | Admitting: Family Medicine

## 2015-05-14 NOTE — Telephone Encounter (Signed)
Caller name: Olean Ree Relation to pt: pt's daughter  Call back number: [phone] Pharmacy:[id number] - [place], [place]  Reason for call:Pt's daughter states that was called 05-12-15 with mother's lab result from our office and was wanting to know with results does her mother need rx for uri, daughter states will pt is going out of town tomorrow and wants to take rx with her. Please advise.

## 2015-05-14 NOTE — Telephone Encounter (Signed)
Pt daughter notified of normal culture. No questions or concerns at this time.

## 2015-05-23 NOTE — Assessment & Plan Note (Signed)
Well controlled, no changes to meds. Encouraged heart healthy diet such as the DASH diet and exercise as tolerated.  °

## 2015-05-23 NOTE — Assessment & Plan Note (Signed)
Has had 2 recent cortisone shots with no significant improvement will continue to follow with ortho, Dr Delilah Shan

## 2015-05-23 NOTE — Assessment & Plan Note (Signed)
Urinalysis shows only contamination, will repeat testing as needed. Maintain hydration and cranberry

## 2015-05-23 NOTE — Progress Notes (Signed)
Subjective:    Patient ID: Sheryl Lang, female    DOB: January 30, 1921, 81 y.o.   MRN: [id number]  Chief Complaint  Patient presents with  . Annual Exam    no concerns     HPI Patient is in today for annual exam. Continues to live in the independent living section at [facility] and is doing well in her home. Denies any recent acute concerns. No recent illness. Is managing her activities of daily living well. No depression. Continues to struggle with right knee pain and is following closely with orthopedics. Is interested in flu shot today. Diarrhea is manageable with occasional Imodium at this time and improved with probiotics. Denies CP/palp/SOB/HA/congestion/fevers/GI or GU c/o. Taking meds as prescribed  Past Medical History  Diagnosis Date  . Osteoporosis   . Hypertension   . Transient memory loss 04/07/2011  . Right knee pain 10/20/2012  . Dermatitis, seborrheic 05/19/2013  . UTI (urinary tract infection) 05/19/2013  . Urinary tract infection, site not specified 05/19/2013  . Medicare annual wellness visit, subsequent 04/05/2014  . Hemorrhoids 11/15/2014    Past Surgical History  Procedure Laterality Date  . Foeign body removal Left 10/20/2014    sewing needle removed from left foot  . Finger sugery Right     Family History  Problem Relation Age of Onset  . Diabetes      family hx  . Mental illness Brother     suicide  . Diabetes Paternal Grandmother   . Diabetes Paternal Grandfather   . Diabetes Sister     Social History   Social History  . Marital Status: Widowed    Spouse Name: N/A  . Number of Children: N/A  . Years of Education: N/A   Occupational History  . Not on file.   Social History Main Topics  . Smoking status: Never Smoker   . Smokeless tobacco: Never Used  . Alcohol Use: 0.0 oz/week    0 Standard drinks or equivalent per week  . Drug Use: Not on file  . Sexual Activity: No     Comment: lives at [facility]   Other Topics Concern  . Not on  file   Social History Narrative    Outpatient Prescriptions Prior to Visit  Medication Sig Dispense Refill  . acetaminophen (TYLENOL) 500 MG tablet 1 tab po twice daily for pain 30 tablet 0  . atenolol (TENORMIN) 50 MG tablet Take 1 tablet (50 mg total) by mouth daily. 90 tablet 2  . b complex vitamins tablet Take 1 tablet by mouth daily.      . citalopram (CELEXA) 20 MG tablet Take 1 tablet (20 mg total) by mouth daily. 90 tablet 2  . CRANBERRY PO Take 1 tablet by mouth daily.    . hydrocortisone (ANUSOL-HC) 25 MG suppository Place 1 suppository (25 mg total) rectally at bedtime as needed for hemorrhoids or itching. 12 suppository 2  . Loperamide HCl (IMODIUM PO) Take by mouth as needed.    . Probiotic Product (PROBIOTIC DAILY) CAPS Take by mouth daily.    . simethicone (MYLICON) 962 MG chewable tablet Chew 125 mg by mouth every 6 (six) hours as needed for flatulence.    . vitamin C (ASCORBIC ACID) 500 MG tablet Take 500 mg by mouth daily.      Marland Kitchen VITAMIN D, CHOLECALCIFEROL, PO Take 1 tablet by mouth daily.      Marland Kitchen zinc gluconate 50 MG tablet Take 50 mg by mouth daily.    Marland Kitchen  KRILL OIL PO Take by mouth.     No facility-administered medications prior to visit.    Allergies  Allergen Reactions  . Penicillins     Review of Systems  Constitutional: Negative for fever, chills and malaise/fatigue.  HENT: Negative for congestion and hearing loss.   Eyes: Negative for discharge.  Respiratory: Negative for cough, sputum production and shortness of breath.   Cardiovascular: Negative for chest pain, palpitations and leg swelling.  Gastrointestinal: Negative for heartburn, nausea, vomiting, abdominal pain, diarrhea, constipation and blood in stool.  Genitourinary: Negative for dysuria, urgency, frequency and hematuria.  Musculoskeletal: Negative for myalgias, back pain and falls.  Skin: Negative for rash.  Neurological: Negative for dizziness, sensory change, loss of consciousness, weakness  and headaches.  Endo/Heme/Allergies: Negative for environmental allergies. Does not bruise/bleed easily.  Psychiatric/Behavioral: Negative for depression and suicidal ideas. The patient is not nervous/anxious and does not have insomnia.        Objective:    Physical Exam  Constitutional: She is oriented to person, place, and time. She appears well-developed and well-nourished. No distress.  HENT:  Head: Normocephalic and atraumatic.  Eyes: Conjunctivae are normal.  Neck: Neck supple. No thyromegaly present.  Cardiovascular: Normal rate, regular rhythm and normal heart sounds.   No murmur heard. Pulmonary/Chest: Effort normal and breath sounds normal. No respiratory distress.  Abdominal: Soft. Bowel sounds are normal. She exhibits no distension and no mass. There is no tenderness.  Musculoskeletal: She exhibits no edema.  Lymphadenopathy:    She has no cervical adenopathy.  Neurological: She is alert and oriented to person, place, and time.  Skin: Skin is warm and dry.  Psychiatric: She has a normal mood and affect. Her behavior is normal.    BP 110/56 mmHg  Pulse 63  Temp(Src) 98.1 F (36.7 C) (Oral)  Resp 18  Ht 5\' 2"  (1.575 m)  Wt 130 lb 12.8 oz (59.33 kg)  BMI 23.92 kg/m2  SpO2 97% Wt Readings from Last 3 Encounters:  05/10/15 130 lb 12.8 oz (59.33 kg)  11/10/14 133 lb 4 oz (60.442 kg)  10/16/14 135 lb (61.236 kg)     Lab Results  Component Value Date   WBC 5.7 05/10/2015   HGB 12.1 05/10/2015   HCT 36.1 05/10/2015   PLT 207.0 05/10/2015   GLUCOSE 81 05/10/2015   ALT 11 05/10/2015   AST 18 05/10/2015   NA 143 05/10/2015   K 4.1 05/10/2015   CL 104 05/10/2015   CREATININE 0.72 05/10/2015   BUN 26* 05/10/2015   CO2 31 05/10/2015   TSH 1.23 05/10/2015    Lab Results  Component Value Date   TSH 1.23 05/10/2015   Lab Results  Component Value Date   WBC 5.7 05/10/2015   HGB 12.1 05/10/2015   HCT 36.1 05/10/2015   MCV 85.8 05/10/2015   PLT 207.0  05/10/2015   Lab Results  Component Value Date   NA 143 05/10/2015   K 4.1 05/10/2015   CO2 31 05/10/2015   GLUCOSE 81 05/10/2015   BUN 26* 05/10/2015   CREATININE 0.72 05/10/2015   BILITOT 0.6 05/10/2015   ALKPHOS 70 05/10/2015   AST 18 05/10/2015   ALT 11 05/10/2015   PROT 6.8 05/10/2015   ALBUMIN 4.0 05/10/2015   CALCIUM 9.6 05/10/2015   GFR 80.44 05/10/2015   No results found for: CHOL No results found for: HDL No results found for: LDLCALC No results found for: TRIG No results found for: CHOLHDL No results  found for: HGBA1C     Assessment & Plan:   Essential hypertension Well controlled, no changes to meds. Encouraged heart healthy diet such as the DASH diet and exercise as tolerated.   Right knee pain Has had 2 recent cortisone shots with no significant improvement will continue to follow with ortho, Dr Melodye Ped annual wellness visit, subsequent Patient denies any difficulties at home. No trouble with ADLs, depression or falls. No recent changes to vision or hearing. Is UTD with immunizations. Is UTD with screening. Discussed Advanced Directives, patient agrees to bring [place] copies of documents if can. Encouraged heart healthy diet, exercise as tolerated and adequate sleep. Labs reviewed, immunizations UTD. Allergies verified: Yes  Immunization Status: Reviewed Prompted for insurance verification: Yes Flu vaccine--Would like to have Tdap-- Last 09/2014 PNA-- Has not had Shingles--Last given 2012  A/P:  Changes to [place], PSH or Personal Hx: Pap--Refuses MMG--Refuses Bone Density-- CCS-- Last 2009  Care Teams Updated:Yes ED/Hospital/Urgent Care Visits: Prompted for: Updated insurance, contact information, forms: Advised to update with front office staff Remind to bring: DPR information, advance directives: Reminded  To Discuss with Provider: No concerns to discuss per daughter.   See problem list for risk factors See AVS for health care screening  reocmmendations  Flu shot given today  Status post foot surgery Pain much better, no new concerns  OTHER OSTEOPOROSIS Stay as active as possible, maintain calcium in diet and supplements tid total. Vitamin D 2000 IU daily  Hypercalcemia Normal with blood work today  Urinary tract infection, site not specified Urinalysis shows only contamination, will repeat testing as needed. Maintain hydration and cranberry   I am having Ms. Amick maintain her b complex vitamins, vitamin C, (VITAMIN D, CHOLECALCIFEROL, PO), acetaminophen, Loperamide HCl (IMODIUM PO), simethicone, KRILL OIL PO, CRANBERRY PO, zinc gluconate, PROBIOTIC DAILY, atenolol, citalopram, hydrocortisone, diclofenac sodium, and psyllium.  Meds ordered this encounter  Medications  . diclofenac sodium (VOLTAREN) 1 % GEL    Sig: Apply 1 application topically 2 (two) times daily as needed.    Refill:  0  . psyllium (METAMUCIL) 58.6 % packet    Sig: Take 1 packet by mouth daily.     Penni Homans, MD

## 2015-05-23 NOTE — Assessment & Plan Note (Signed)
Normal with blood work today

## 2015-05-23 NOTE — Assessment & Plan Note (Signed)
Pain much better, no new concerns

## 2015-05-23 NOTE — Assessment & Plan Note (Addendum)
Patient denies any difficulties at home. No trouble with ADLs, depression or falls. No recent changes to vision or hearing. Is UTD with immunizations. Is UTD with screening. Discussed Advanced Directives, patient agrees to bring [place] copies of documents if can. Encouraged heart healthy diet, exercise as tolerated and adequate sleep. Labs reviewed, immunizations UTD. Allergies verified: Yes  Immunization Status: Reviewed Prompted for insurance verification: Yes Flu vaccine--Would like to have Tdap-- Last 09/2014 PNA-- Has not had Shingles--Last given 2012  A/P:  Changes to [place], PSH or Personal Hx: Pap--Refuses MMG--Refuses Bone Density-- CCS-- Last 2009  Care Teams Updated:Yes ED/Hospital/Urgent Care Visits: Prompted for: Updated insurance, contact information, forms: Advised to update with front office staff Remind to bring: DPR information, advance directives: Reminded  To Discuss with Provider: No concerns to discuss per daughter.   See problem list for risk factors See AVS for health care screening reocmmendations  Flu shot given today

## 2015-05-23 NOTE — Assessment & Plan Note (Signed)
Stay as active as possible, maintain calcium in diet and supplements tid total. Vitamin D 2000 IU daily

## 2015-06-03 DIAGNOSIS — R2689 Other abnormalities of gait and mobility: Secondary | ICD-10-CM | POA: Diagnosis not present

## 2015-06-03 DIAGNOSIS — M6281 Muscle weakness (generalized): Secondary | ICD-10-CM | POA: Diagnosis not present

## 2015-06-07 DIAGNOSIS — M1711 Unilateral primary osteoarthritis, right knee: Secondary | ICD-10-CM | POA: Diagnosis not present

## 2015-06-08 DIAGNOSIS — R2689 Other abnormalities of gait and mobility: Secondary | ICD-10-CM | POA: Diagnosis not present

## 2015-06-08 DIAGNOSIS — M6281 Muscle weakness (generalized): Secondary | ICD-10-CM | POA: Diagnosis not present

## 2015-06-09 DIAGNOSIS — H2510 Age-related nuclear cataract, unspecified eye: Secondary | ICD-10-CM | POA: Diagnosis not present

## 2015-06-09 DIAGNOSIS — H353230 Exudative age-related macular degeneration, bilateral, stage unspecified: Secondary | ICD-10-CM | POA: Diagnosis not present

## 2015-06-10 DIAGNOSIS — M6281 Muscle weakness (generalized): Secondary | ICD-10-CM | POA: Diagnosis not present

## 2015-06-10 DIAGNOSIS — R2689 Other abnormalities of gait and mobility: Secondary | ICD-10-CM | POA: Diagnosis not present

## 2015-06-11 DIAGNOSIS — M79674 Pain in right toe(s): Secondary | ICD-10-CM | POA: Diagnosis not present

## 2015-06-11 DIAGNOSIS — M79675 Pain in left toe(s): Secondary | ICD-10-CM | POA: Diagnosis not present

## 2015-06-11 DIAGNOSIS — B351 Tinea unguium: Secondary | ICD-10-CM | POA: Diagnosis not present

## 2015-06-15 DIAGNOSIS — M1711 Unilateral primary osteoarthritis, right knee: Secondary | ICD-10-CM | POA: Diagnosis not present

## 2015-06-15 DIAGNOSIS — M25561 Pain in right knee: Secondary | ICD-10-CM | POA: Diagnosis not present

## 2015-06-17 DIAGNOSIS — M6281 Muscle weakness (generalized): Secondary | ICD-10-CM | POA: Diagnosis not present

## 2015-06-17 DIAGNOSIS — R2689 Other abnormalities of gait and mobility: Secondary | ICD-10-CM | POA: Diagnosis not present

## 2015-06-22 DIAGNOSIS — M1711 Unilateral primary osteoarthritis, right knee: Secondary | ICD-10-CM | POA: Diagnosis not present

## 2015-06-22 DIAGNOSIS — R2689 Other abnormalities of gait and mobility: Secondary | ICD-10-CM | POA: Diagnosis not present

## 2015-06-22 DIAGNOSIS — M6281 Muscle weakness (generalized): Secondary | ICD-10-CM | POA: Diagnosis not present

## 2015-06-24 DIAGNOSIS — R2689 Other abnormalities of gait and mobility: Secondary | ICD-10-CM | POA: Diagnosis not present

## 2015-06-24 DIAGNOSIS — M6281 Muscle weakness (generalized): Secondary | ICD-10-CM | POA: Diagnosis not present

## 2015-06-30 DIAGNOSIS — M6281 Muscle weakness (generalized): Secondary | ICD-10-CM | POA: Diagnosis not present

## 2015-06-30 DIAGNOSIS — R2689 Other abnormalities of gait and mobility: Secondary | ICD-10-CM | POA: Diagnosis not present

## 2015-07-01 DIAGNOSIS — R2689 Other abnormalities of gait and mobility: Secondary | ICD-10-CM | POA: Diagnosis not present

## 2015-07-01 DIAGNOSIS — M6281 Muscle weakness (generalized): Secondary | ICD-10-CM | POA: Diagnosis not present

## 2015-07-06 DIAGNOSIS — R2689 Other abnormalities of gait and mobility: Secondary | ICD-10-CM | POA: Diagnosis not present

## 2015-07-06 DIAGNOSIS — M6281 Muscle weakness (generalized): Secondary | ICD-10-CM | POA: Diagnosis not present

## 2015-07-08 DIAGNOSIS — R2689 Other abnormalities of gait and mobility: Secondary | ICD-10-CM | POA: Diagnosis not present

## 2015-07-08 DIAGNOSIS — M6281 Muscle weakness (generalized): Secondary | ICD-10-CM | POA: Diagnosis not present

## 2015-07-13 DIAGNOSIS — M6281 Muscle weakness (generalized): Secondary | ICD-10-CM | POA: Diagnosis not present

## 2015-07-13 DIAGNOSIS — R2689 Other abnormalities of gait and mobility: Secondary | ICD-10-CM | POA: Diagnosis not present

## 2015-07-15 DIAGNOSIS — M6281 Muscle weakness (generalized): Secondary | ICD-10-CM | POA: Diagnosis not present

## 2015-07-15 DIAGNOSIS — R2689 Other abnormalities of gait and mobility: Secondary | ICD-10-CM | POA: Diagnosis not present

## 2015-08-03 DIAGNOSIS — H18411 Arcus senilis, right eye: Secondary | ICD-10-CM | POA: Diagnosis not present

## 2015-08-03 DIAGNOSIS — H2512 Age-related nuclear cataract, left eye: Secondary | ICD-10-CM | POA: Diagnosis not present

## 2015-08-03 DIAGNOSIS — H353130 Nonexudative age-related macular degeneration, bilateral, stage unspecified: Secondary | ICD-10-CM | POA: Diagnosis not present

## 2015-08-03 DIAGNOSIS — H18412 Arcus senilis, left eye: Secondary | ICD-10-CM | POA: Diagnosis not present

## 2015-08-03 DIAGNOSIS — H2511 Age-related nuclear cataract, right eye: Secondary | ICD-10-CM | POA: Diagnosis not present

## 2015-08-13 DIAGNOSIS — M79674 Pain in right toe(s): Secondary | ICD-10-CM | POA: Diagnosis not present

## 2015-08-13 DIAGNOSIS — M79675 Pain in left toe(s): Secondary | ICD-10-CM | POA: Diagnosis not present

## 2015-08-13 DIAGNOSIS — B351 Tinea unguium: Secondary | ICD-10-CM | POA: Diagnosis not present

## 2015-08-31 DIAGNOSIS — L218 Other seborrheic dermatitis: Secondary | ICD-10-CM | POA: Diagnosis not present

## 2015-10-15 DIAGNOSIS — M79675 Pain in left toe(s): Secondary | ICD-10-CM | POA: Diagnosis not present

## 2015-10-15 DIAGNOSIS — B351 Tinea unguium: Secondary | ICD-10-CM | POA: Diagnosis not present

## 2015-10-15 DIAGNOSIS — M79674 Pain in right toe(s): Secondary | ICD-10-CM | POA: Diagnosis not present

## 2015-11-08 ENCOUNTER — Ambulatory Visit (INDEPENDENT_AMBULATORY_CARE_PROVIDER_SITE_OTHER): Payer: Medicare Other | Admitting: Family Medicine

## 2015-11-08 ENCOUNTER — Encounter: Payer: Self-pay | Admitting: Family Medicine

## 2015-11-08 VITALS — BP 131/57 | HR 64 | Temp 98.0°F | Ht 62.0 in | Wt 133.4 lb

## 2015-11-08 DIAGNOSIS — I10 Essential (primary) hypertension: Secondary | ICD-10-CM

## 2015-11-08 DIAGNOSIS — R3915 Urgency of urination: Secondary | ICD-10-CM

## 2015-11-08 DIAGNOSIS — M25561 Pain in right knee: Secondary | ICD-10-CM

## 2015-11-08 DIAGNOSIS — L729 Follicular cyst of the skin and subcutaneous tissue, unspecified: Secondary | ICD-10-CM

## 2015-11-08 DIAGNOSIS — Z0000 Encounter for general adult medical examination without abnormal findings: Secondary | ICD-10-CM

## 2015-11-08 DIAGNOSIS — F329 Major depressive disorder, single episode, unspecified: Secondary | ICD-10-CM

## 2015-11-08 DIAGNOSIS — F32A Depression, unspecified: Secondary | ICD-10-CM

## 2015-11-08 DIAGNOSIS — N390 Urinary tract infection, site not specified: Secondary | ICD-10-CM

## 2015-11-08 DIAGNOSIS — Z23 Encounter for immunization: Secondary | ICD-10-CM

## 2015-11-08 DIAGNOSIS — R413 Other amnesia: Secondary | ICD-10-CM

## 2015-11-08 HISTORY — DX: Follicular cyst of the skin and subcutaneous tissue, unspecified: L72.9

## 2015-11-08 MED ORDER — CITALOPRAM HYDROBROMIDE 20 MG PO TABS: 20.0000 mg | ORAL_TABLET | Freq: Every day | ORAL | Status: AC

## 2015-11-08 MED ORDER — DONEPEZIL HCL 10 MG PO TABS: 5.0000 mg | ORAL_TABLET | Freq: Every day | ORAL | Status: DC

## 2015-11-08 MED ORDER — ATENOLOL 50 MG PO TABS: 50.0000 mg | ORAL_TABLET | Freq: Every day | ORAL | Status: DC

## 2015-11-08 NOTE — Assessment & Plan Note (Addendum)
Well controlled, no changes to meds. Encouraged heart healthy diet such as the DASH diet and exercise as tolerated. Given Prevnar today

## 2015-11-08 NOTE — Assessment & Plan Note (Signed)
Check UA today.  

## 2015-11-08 NOTE — Patient Instructions (Addendum)
Hypertension Hypertension, commonly called high blood pressure, is when the force of blood pumping through your arteries is too strong. Your arteries are the blood vessels that carry blood from your heart throughout your body. A blood pressure reading consists of a higher number over a lower number, such as 110/72. The higher number (systolic) is the pressure inside your arteries when your heart pumps. The lower number (diastolic) is the pressure inside your arteries when your heart relaxes. Ideally you want your blood pressure below 120/80. Hypertension forces your heart to work harder to pump blood. Your arteries may become narrow or stiff. Having untreated or uncontrolled hypertension can cause heart attack, stroke, kidney disease, and other problems. RISK FACTORS Some risk factors for high blood pressure are controllable. Others are not.  Risk factors you cannot control include:   Race. You may be at higher risk if you are African American.  Age. Risk increases with age.  Gender. Men are at higher risk than women before age 45 years. After age 65, women are at higher risk than men. Risk factors you can control include:  Not getting enough exercise or physical activity.  Being overweight.  Getting too much fat, sugar, calories, or salt in your diet.  Drinking too much alcohol. SIGNS AND SYMPTOMS Hypertension does not usually cause signs or symptoms. Extremely high blood pressure (hypertensive crisis) may cause headache, anxiety, shortness of breath, and nosebleed. DIAGNOSIS To check if you have hypertension, your health care provider will measure your blood pressure while you are seated, with your arm held at the level of your heart. It should be measured at least twice using the same arm. Certain conditions can cause a difference in blood pressure between your right and left arms. A blood pressure reading that is higher than normal on one occasion does not mean that you need treatment. If  it is not clear whether you have high blood pressure, you may be asked to return on a different day to have your blood pressure checked again. Or, you may be asked to monitor your blood pressure at home for 1 or more weeks. TREATMENT Treating high blood pressure includes making lifestyle changes and possibly taking medicine. Living a healthy lifestyle can help lower high blood pressure. You may need to change some of your habits. Lifestyle changes may include:  Following the DASH diet. This diet is high in fruits, vegetables, and whole grains. It is low in salt, red meat, and added sugars.  Keep your sodium intake below 2,300 mg per day.  Getting at least 30-45 minutes of aerobic exercise at least 4 times per week.  Losing weight if necessary.  Not smoking.  Limiting alcoholic beverages.  Learning ways to reduce stress. Your health care provider may prescribe medicine if lifestyle changes are not enough to get your blood pressure under control, and if one of the following is true:  You are 18-59 years of age and your systolic blood pressure is above 140.  You are 60 years of age or older, and your systolic blood pressure is above 150.  Your diastolic blood pressure is above 90.  You have diabetes, and your systolic blood pressure is over 140 or your diastolic blood pressure is over 90.  You have kidney disease and your blood pressure is above 140/90.  You have heart disease and your blood pressure is above 140/90. Your personal target blood pressure may vary depending on your medical conditions, your age, and other factors. HOME CARE INSTRUCTIONS    Have your blood pressure rechecked as directed by your health care provider.   Take medicines only as directed by your health care provider. Follow the directions carefully. Blood pressure medicines must be taken as prescribed. The medicine does not work as well when you skip doses. Skipping doses also puts you at risk for  problems.  Do not smoke.   Monitor your blood pressure at home as directed by your health care provider. SEEK MEDICAL CARE IF:   You think you are having a reaction to medicines taken.  You have recurrent headaches or feel dizzy.  You have swelling in your ankles.  You have trouble with your vision. SEEK IMMEDIATE MEDICAL CARE IF:  You develop a severe headache or confusion.  You have unusual weakness, numbness, or feel faint.  You have severe chest or abdominal pain.  You vomit repeatedly.  You have trouble breathing. MAKE SURE YOU:   Understand these instructions.  Will watch your condition.  Will get help right away if you are not doing well or get worse.   This information is not intended to replace advice given to you by your health care provider. Make sure you discuss any questions you have with your health care provider.   Document Released: 08/14/2005 Document Revised: 12/29/2014 Document Reviewed: 06/06/2013 Elsevier Interactive Patient Education 2016 [employer]  

## 2015-11-08 NOTE — Assessment & Plan Note (Addendum)
Lives at [facility] in [place]. Needs a little more assistance but with family checking in she does well still. Will try a course of Aricept to see if that helps

## 2015-11-08 NOTE — Assessment & Plan Note (Signed)
Patient follows with [place] ortho, did not respond to shots, alternates Tylenol and Advil only 1-2 tabs daily, check CMP

## 2015-11-08 NOTE — Assessment & Plan Note (Signed)
Under left posterior bra strap, becomes irritated. Agrees to have the dermatologist at [facility] look at it.

## 2015-11-08 NOTE — Progress Notes (Signed)
Subjective:    Patient ID: Sheryl Lang, female    DOB: July 21, 1921, 82 y.o.   MRN: [id number]  Chief Complaint  Patient presents with  . Follow-up    HPI  Patient is in today for follow up with numerous concerns.  Patient is having more problems with more incontinence, wetting clothes at least once a day.  Some occasional diarrhea and having some problems with confusion every couple of months.  Patients right knee is still bothering here some. Is here today with her daughter. No recent hospitalization or acute illness noted. Denies CP/palp/SOB/HA/congestion/fevers/GI c/o. Taking meds as prescribed  Past Medical History  Diagnosis Date  . Osteoporosis   . Hypertension   . Transient memory loss 04/07/2011  . Right knee pain 10/20/2012  . Dermatitis, seborrheic 05/19/2013  . UTI (urinary tract infection) 05/19/2013  . Urinary tract infection, site not specified 05/19/2013  . Medicare annual wellness visit, subsequent 04/05/2014  . Hemorrhoids 11/15/2014    Past Surgical History  Procedure Laterality Date  . Foeign body removal Left 10/20/2014    sewing needle removed from left foot  . Finger sugery Right     Family History  Problem Relation Age of Onset  . Diabetes      family hx  . Mental illness Brother     suicide  . Diabetes Paternal Grandmother   . Diabetes Paternal Grandfather   . Diabetes Sister     Social History   Social History  . Marital Status: Widowed    Spouse Name: N/A  . Number of Children: N/A  . Years of Education: N/A   Occupational History  . Not on file.   Social History Main Topics  . Smoking status: Never Smoker   . Smokeless tobacco: Never Used  . Alcohol Use: 0.0 oz/week    0 Standard drinks or equivalent per week  . Drug Use: Not on file  . Sexual Activity: No     Comment: lives at [facility]   Other Topics Concern  . Not on file   Social History Narrative    Outpatient Prescriptions Prior to Visit  Medication Sig Dispense  Refill  . acetaminophen (TYLENOL) 500 MG tablet 1 tab po twice daily for pain 30 tablet 0  . diclofenac sodium (VOLTAREN) 1 % GEL Apply 1 application topically 2 (two) times daily as needed.  0  . hydrocortisone (ANUSOL-HC) 25 MG suppository Place 1 suppository (25 mg total) rectally at bedtime as needed for hemorrhoids or itching. 12 suppository 2  . Loperamide HCl (IMODIUM PO) Take by mouth as needed.    . Probiotic Product (PROBIOTIC DAILY) CAPS Take by mouth daily.    . psyllium (METAMUCIL) 58.6 % packet Take 1 packet by mouth daily.    . simethicone (MYLICON) 0000000 MG chewable tablet Chew 125 mg by mouth every 6 (six) hours as needed for flatulence.    Marland Kitchen VITAMIN D, CHOLECALCIFEROL, PO Take 1 tablet by mouth daily.      Marland Kitchen zinc gluconate 50 MG tablet Take 50 mg by mouth daily.    Marland Kitchen atenolol (TENORMIN) 50 MG tablet Take 1 tablet (50 mg total) by mouth daily. 90 tablet 2  . citalopram (CELEXA) 20 MG tablet Take 1 tablet (20 mg total) by mouth daily. 90 tablet 2  . b complex vitamins tablet Take 1 tablet by mouth daily.      Marland Kitchen CRANBERRY PO Take 1 tablet by mouth daily.    Marland Kitchen KRILL OIL PO  Take by mouth.    . vitamin C (ASCORBIC ACID) 500 MG tablet Take 500 mg by mouth daily.       No facility-administered medications prior to visit.    Allergies  Allergen Reactions  . Penicillins     Review of Systems  Constitutional: Positive for malaise/fatigue. Negative for fever.  HENT: Negative for congestion.   Eyes: Negative for blurred vision.  Respiratory: Negative for shortness of breath.   Cardiovascular: Negative for chest pain, palpitations and leg swelling.  Gastrointestinal: Negative for nausea, abdominal pain and blood in stool.  Genitourinary: Negative for dysuria and frequency.  Musculoskeletal: Negative for falls.  Skin: Negative for rash.  Neurological: Negative for dizziness, loss of consciousness and headaches.  Endo/Heme/Allergies: Negative for environmental allergies.    Psychiatric/Behavioral: Positive for memory loss. Negative for depression. The patient is not nervous/anxious.        Objective:    Physical Exam  Constitutional: She is oriented to person, place, and time. She appears well-developed and well-nourished. No distress.  HENT:  Head: Normocephalic and atraumatic.  Right Ear: External ear normal.  Left Ear: External ear normal.  Nose: Nose normal.  Mouth/Throat: Oropharynx is clear and moist.  Eyes: Conjunctivae and EOM are normal. Pupils are equal, round, and reactive to light. Right eye exhibits no discharge. Left eye exhibits no discharge.  Neck: Normal range of motion. Neck supple. No JVD present. No thyromegaly present.  Cardiovascular: Normal rate, regular rhythm, normal heart sounds and intact distal pulses.   No murmur heard. Pulmonary/Chest: Effort normal and breath sounds normal. No respiratory distress. She has no wheezes. She has no rales. She exhibits no tenderness.  Abdominal: Soft. Bowel sounds are normal. She exhibits no distension and no mass. There is no tenderness. There is no rebound and no guarding.  Genitourinary: Vagina normal and uterus normal. Guaiac negative stool. No vaginal discharge found.  Musculoskeletal: Normal range of motion. She exhibits no edema or tenderness.  Lymphadenopathy:    She has no cervical adenopathy.  Neurological: She is alert and oriented to person, place, and time. She has normal reflexes. No cranial nerve deficit. Coordination abnormal.  Skin: Skin is warm and dry. No rash noted. She is not diaphoretic. No erythema.  Psychiatric: She has a normal mood and affect. Her behavior is normal. Judgment and thought content normal.  Nursing note and vitals reviewed.   BP 131/57 mmHg  Pulse 64  Temp(Src) 98 F (36.7 C) (Oral)  Ht 5\' 2"  (1.575 m)  Wt 133 lb 6 oz (60.499 kg)  BMI 24.39 kg/m2  SpO2 98% Wt Readings from Last 3 Encounters:  11/08/15 133 lb 6 oz (60.499 kg)  05/10/15 130 lb  12.8 oz (59.33 kg)  11/10/14 133 lb 4 oz (60.442 kg)     Lab Results  Component Value Date   WBC 5.7 05/10/2015   HGB 12.1 05/10/2015   HCT 36.1 05/10/2015   PLT 207.0 05/10/2015   GLUCOSE 81 05/10/2015   ALT 11 05/10/2015   AST 18 05/10/2015   NA 143 05/10/2015   K 4.1 05/10/2015   CL 104 05/10/2015   CREATININE 0.72 05/10/2015   BUN 26* 05/10/2015   CO2 31 05/10/2015   TSH 1.23 05/10/2015    Lab Results  Component Value Date   TSH 1.23 05/10/2015   Lab Results  Component Value Date   WBC 5.7 05/10/2015   HGB 12.1 05/10/2015   HCT 36.1 05/10/2015   MCV 85.8 05/10/2015  PLT 207.0 05/10/2015   Lab Results  Component Value Date   NA 143 05/10/2015   K 4.1 05/10/2015   CO2 31 05/10/2015   GLUCOSE 81 05/10/2015   BUN 26* 05/10/2015   CREATININE 0.72 05/10/2015   BILITOT 0.6 05/10/2015   ALKPHOS 70 05/10/2015   AST 18 05/10/2015   ALT 11 05/10/2015   PROT 6.8 05/10/2015   ALBUMIN 4.0 05/10/2015   CALCIUM 9.6 05/10/2015   GFR 80.44 05/10/2015   No results found for: CHOL No results found for: HDL No results found for: LDLCALC No results found for: TRIG No results found for: CHOLHDL No results found for: HGBA1C     Assessment & Plan:   Problem List Items Addressed This Visit    Depression    hgba1c acceptable, minimize simple carbs. Increase exercise as tolerated. Continue current meds      Relevant Medications   citalopram (CELEXA) 20 MG tablet   Essential hypertension    Well controlled, no changes to meds. Encouraged heart healthy diet such as the DASH diet and exercise as tolerated. Given Prevnar today      Relevant Medications   atenolol (TENORMIN) 50 MG tablet   Other Relevant Orders   Urinalysis   Urine culture   Comprehensive metabolic panel   CBC   Hypercalcemia   Relevant Orders   Urinalysis   Urine culture   Comprehensive metabolic panel   CBC   Memory loss    Lives at [facility] in [place]. Needs a little more  assistance but with family checking in she does well still. Will try a course of Aricept to see if that helps      Right knee pain    Patient follows with [facility], did not respond to shots, alternates Tylenol and Advil only 1-2 tabs daily, check CMP      Urinary tract infection, site not specified    Check UA today       Other Visit Diagnoses    Urinary urgency    -  Primary    Relevant Orders    Urinalysis    Urine culture    Preventative health care        Relevant Orders    Urinalysis    Urine culture    Comprehensive metabolic panel    CBC    Need for vaccination with 13-polyvalent pneumococcal conjugate vaccine        Relevant Orders    Pneumococcal conjugate vaccine 13-valent IM       I have discontinued Ms. Grime's b complex vitamins, vitamin C, KRILL OIL PO, and CRANBERRY PO. I am also having her start on donepezil. Additionally, I am having her maintain her (VITAMIN D, CHOLECALCIFEROL, PO), acetaminophen, Loperamide HCl (IMODIUM PO), simethicone, zinc gluconate, PROBIOTIC DAILY, hydrocortisone, diclofenac sodium, psyllium, atenolol, and citalopram.  Meds ordered this encounter  Medications  . atenolol (TENORMIN) 50 MG tablet    Sig: Take 1 tablet (50 mg total) by mouth daily.    Dispense:  90 tablet    Refill:  2  . citalopram (CELEXA) 20 MG tablet    Sig: Take 1 tablet (20 mg total) by mouth daily.    Dispense:  90 tablet    Refill:  2  . donepezil (ARICEPT) 10 MG tablet    Sig: Take 0.5-1 tablets (5-10 mg total) by mouth at bedtime. Take one half tablet x 30 days then take one tablet after 30 days.    Dispense:  30 tablet    Refill:  3     Penni Homans, MD

## 2015-11-08 NOTE — Progress Notes (Signed)
Pre visit review using our clinic review tool, if applicable. No additional management support is needed unless otherwise documented below in the visit note. 

## 2015-11-08 NOTE — Assessment & Plan Note (Signed)
hgba1c acceptable, minimize simple carbs. Increase exercise as tolerated. Continue current meds 

## 2015-11-09 LAB — COMPREHENSIVE METABOLIC PANEL
ALBUMIN: 4 g/dL (ref 3.5–5.2)
ALT: 9 U/L (ref 0–35)
AST: 17 U/L (ref 0–37)
Alkaline Phosphatase: 89 U/L (ref 39–117)
BILIRUBIN TOTAL: 0.5 mg/dL (ref 0.2–1.2)
BUN: 26 mg/dL — AB (ref 6–23)
CALCIUM: 9.4 mg/dL (ref 8.4–10.5)
CO2: 27 meq/L (ref 19–32)
CREATININE: 0.82 mg/dL (ref 0.40–1.20)
Chloride: 105 mEq/L (ref 96–112)
GFR: 69.16 mL/min (ref >60.00)
Glucose, Bld: 96 mg/dL (ref 70–99)
Potassium: 4.2 mEq/L (ref 3.5–5.1)
SODIUM: 141 meq/L (ref 135–145)
Total Protein: 6.9 g/dL (ref 6.0–8.3)

## 2015-11-09 LAB — CBC
HCT: 35.1 % — ABNORMAL LOW (ref 36.0–46.0)
Hemoglobin: 11.7 g/dL — ABNORMAL LOW (ref 12.0–15.0)
MCHC: 33.3 g/dL (ref 30.0–36.0)
MCV: 85 fl (ref 78.0–100.0)
Platelets: 264 10*3/uL (ref 150.0–400.0)
RBC: 4.13 Mil/uL (ref 3.87–5.11)
RDW: 14.3 % (ref 11.5–15.5)
WBC: 8.2 10*3/uL (ref 4.0–10.5)

## 2015-11-09 LAB — URINE CULTURE: Colony Count: 7000

## 2015-12-24 DIAGNOSIS — B351 Tinea unguium: Secondary | ICD-10-CM | POA: Diagnosis not present

## 2015-12-24 DIAGNOSIS — M79675 Pain in left toe(s): Secondary | ICD-10-CM | POA: Diagnosis not present

## 2016-02-01 ENCOUNTER — Encounter: Payer: Self-pay | Admitting: Family

## 2016-02-01 ENCOUNTER — Emergency Department (HOSPITAL_BASED_OUTPATIENT_CLINIC_OR_DEPARTMENT_OTHER): Payer: Medicare Other

## 2016-02-01 ENCOUNTER — Ambulatory Visit (INDEPENDENT_AMBULATORY_CARE_PROVIDER_SITE_OTHER): Payer: Medicare Other | Admitting: Family

## 2016-02-01 ENCOUNTER — Encounter (HOSPITAL_BASED_OUTPATIENT_CLINIC_OR_DEPARTMENT_OTHER): Payer: Self-pay | Admitting: *Deleted

## 2016-02-01 ENCOUNTER — Observation Stay (HOSPITAL_BASED_OUTPATIENT_CLINIC_OR_DEPARTMENT_OTHER)
Admission: EM | Admit: 2016-02-01 | Discharge: 2016-02-03 | Disposition: A | Discharge status: Skilled Nursing Facility | Payer: Medicare Other | Attending: Internal Medicine | Admitting: Internal Medicine

## 2016-02-01 VITALS — BP 112/70 | HR 84 | Temp 98.5°F | Resp 16 | Ht 62.0 in | Wt 132.0 lb

## 2016-02-01 DIAGNOSIS — W19XXXA Unspecified fall, initial encounter: Secondary | ICD-10-CM | POA: Diagnosis not present

## 2016-02-01 DIAGNOSIS — R55 Syncope and collapse: Secondary | ICD-10-CM | POA: Diagnosis present

## 2016-02-01 DIAGNOSIS — S62102A Fracture of unspecified carpal bone, left wrist, initial encounter for closed fracture: Secondary | ICD-10-CM | POA: Diagnosis present

## 2016-02-01 DIAGNOSIS — E861 Hypovolemia: Secondary | ICD-10-CM | POA: Diagnosis not present

## 2016-02-01 DIAGNOSIS — S52592A Other fractures of lower end of left radius, initial encounter for closed fracture: Secondary | ICD-10-CM | POA: Diagnosis not present

## 2016-02-01 DIAGNOSIS — M7989 Other specified soft tissue disorders: Secondary | ICD-10-CM | POA: Insufficient documentation

## 2016-02-01 DIAGNOSIS — Z79899 Other long term (current) drug therapy: Secondary | ICD-10-CM | POA: Insufficient documentation

## 2016-02-01 DIAGNOSIS — E860 Dehydration: Secondary | ICD-10-CM | POA: Diagnosis not present

## 2016-02-01 DIAGNOSIS — S52502A Unspecified fracture of the lower end of left radius, initial encounter for closed fracture: Secondary | ICD-10-CM | POA: Diagnosis not present

## 2016-02-01 DIAGNOSIS — F0390 Unspecified dementia without behavioral disturbance: Secondary | ICD-10-CM | POA: Diagnosis not present

## 2016-02-01 DIAGNOSIS — S199XXA Unspecified injury of neck, initial encounter: Secondary | ICD-10-CM | POA: Diagnosis not present

## 2016-02-01 DIAGNOSIS — S59292A Other physeal fracture of lower end of radius, left arm, initial encounter for closed fracture: Secondary | ICD-10-CM | POA: Diagnosis not present

## 2016-02-01 DIAGNOSIS — I10 Essential (primary) hypertension: Secondary | ICD-10-CM | POA: Insufficient documentation

## 2016-02-01 DIAGNOSIS — S52509A Unspecified fracture of the lower end of unspecified radius, initial encounter for closed fracture: Secondary | ICD-10-CM | POA: Insufficient documentation

## 2016-02-01 DIAGNOSIS — D649 Anemia, unspecified: Secondary | ICD-10-CM | POA: Insufficient documentation

## 2016-02-01 DIAGNOSIS — N390 Urinary tract infection, site not specified: Secondary | ICD-10-CM | POA: Diagnosis not present

## 2016-02-01 DIAGNOSIS — E876 Hypokalemia: Secondary | ICD-10-CM | POA: Insufficient documentation

## 2016-02-01 DIAGNOSIS — R569 Unspecified convulsions: Secondary | ICD-10-CM | POA: Diagnosis not present

## 2016-02-01 DIAGNOSIS — S299XXA Unspecified injury of thorax, initial encounter: Secondary | ICD-10-CM | POA: Diagnosis not present

## 2016-02-01 DIAGNOSIS — I951 Orthostatic hypotension: Secondary | ICD-10-CM | POA: Diagnosis not present

## 2016-02-01 LAB — COMPREHENSIVE METABOLIC PANEL
ALK PHOS: 74 U/L (ref 38–126)
ALT: 12 U/L — AB (ref 14–54)
AST: 22 U/L (ref 15–41)
Albumin: 3.7 g/dL (ref 3.5–5.0)
Anion gap: 8 (ref 5–15)
BUN: 28 mg/dL — AB (ref 6–20)
CHLORIDE: 103 mmol/L (ref 101–111)
CO2: 27 mmol/L (ref 22–32)
CREATININE: 0.76 mg/dL (ref 0.44–1.00)
Calcium: 9.2 mg/dL (ref 8.9–10.3)
GFR calc Af Amer: >60 mL/min (ref >60)
Glucose, Bld: 117 mg/dL — ABNORMAL HIGH (ref 65–99)
Potassium: 4 mmol/L (ref 3.5–5.1)
SODIUM: 138 mmol/L (ref 135–145)
Total Bilirubin: 0.8 mg/dL (ref 0.3–1.2)
Total Protein: 6.8 g/dL (ref 6.5–8.1)

## 2016-02-01 LAB — CBC WITH DIFFERENTIAL/PLATELET
BASOS ABS: 0 10*3/uL (ref 0.0–0.1)
Basophils Relative: 0 %
EOS PCT: 2 %
Eosinophils Absolute: 0.1 10*3/uL (ref 0.0–0.7)
HCT: 35.5 % — ABNORMAL LOW (ref 36.0–46.0)
HEMOGLOBIN: 11.6 g/dL — AB (ref 12.0–15.0)
LYMPHS PCT: 13 %
Lymphs Abs: 0.9 10*3/uL (ref 0.7–4.0)
MCH: 28.6 pg (ref 26.0–34.0)
MCHC: 32.7 g/dL (ref 30.0–36.0)
MCV: 87.7 fL (ref 78.0–100.0)
Monocytes Absolute: 0.7 10*3/uL (ref 0.1–1.0)
Monocytes Relative: 10 %
NEUTROS ABS: 5.6 10*3/uL (ref 1.7–7.7)
NEUTROS PCT: 75 %
PLATELETS: 226 10*3/uL (ref 150–400)
RBC: 4.05 MIL/uL (ref 3.87–5.11)
RDW: 13.7 % (ref 11.5–15.5)
WBC: 7.4 10*3/uL (ref 4.0–10.5)

## 2016-02-01 LAB — MAGNESIUM: Magnesium: 1.6 mg/dL — ABNORMAL LOW (ref 1.7–2.4)

## 2016-02-01 LAB — URINE MICROSCOPIC-ADD ON: RBC / HPF: NONE SEEN RBC/hpf (ref 0–5)

## 2016-02-01 LAB — URINALYSIS, ROUTINE W REFLEX MICROSCOPIC
Bilirubin Urine: NEGATIVE
Glucose, UA: NEGATIVE mg/dL
HGB URINE DIPSTICK: NEGATIVE
Ketones, ur: 15 mg/dL — AB
NITRITE: NEGATIVE
PROTEIN: NEGATIVE mg/dL
Specific Gravity, Urine: 1.012 (ref 1.005–1.030)
pH: 7 (ref 5.0–8.0)

## 2016-02-01 LAB — TROPONIN I: Troponin I: <0.03 ng/mL (ref <0.031)

## 2016-02-01 LAB — MRSA PCR SCREENING: MRSA BY PCR: NEGATIVE

## 2016-02-01 MED ORDER — ENOXAPARIN SODIUM 40 MG/0.4ML ~~LOC~~ SOLN
40.0000 mg | SUBCUTANEOUS | Status: DC
  Administered 2016-02-02 (×2): 40 mg via SUBCUTANEOUS
  Filled 2016-02-01 (×3): qty 0.4

## 2016-02-01 MED ORDER — SACCHAROMYCES BOULARDII 250 MG PO CAPS
250.0000 mg | ORAL_CAPSULE | Freq: Two times a day (BID) | ORAL | Status: DC
  Administered 2016-02-02 – 2016-02-03 (×3): 250 mg via ORAL
  Filled 2016-02-01 (×5): qty 1

## 2016-02-01 MED ORDER — ACETAMINOPHEN 650 MG RE SUPP: 650.0000 mg | Freq: Four times a day (QID) | RECTAL | Status: DC | PRN

## 2016-02-01 MED ORDER — ONDANSETRON HCL 4 MG PO TABS: 4.0000 mg | ORAL_TABLET | Freq: Four times a day (QID) | ORAL | Status: DC | PRN

## 2016-02-01 MED ORDER — ACETAMINOPHEN 325 MG PO TABS: 650.0000 mg | ORAL_TABLET | Freq: Four times a day (QID) | ORAL | Status: DC | PRN

## 2016-02-01 MED ORDER — CITALOPRAM HYDROBROMIDE 20 MG PO TABS
20.0000 mg | ORAL_TABLET | Freq: Every day | ORAL | Status: DC
  Administered 2016-02-02 – 2016-02-03 (×2): 20 mg via ORAL
  Filled 2016-02-01 (×2): qty 1

## 2016-02-01 MED ORDER — SODIUM CHLORIDE 0.9 % IV BOLUS (SEPSIS)
1000.0000 mL | Freq: Once | INTRAVENOUS | Status: AC
  Administered 2016-02-01: 1000 mL via INTRAVENOUS

## 2016-02-01 MED ORDER — SODIUM CHLORIDE 0.9 % IV SOLN
INTRAVENOUS | Status: DC
  Administered 2016-02-01 – 2016-02-02 (×2): via INTRAVENOUS

## 2016-02-01 MED ORDER — MAGNESIUM SULFATE 2 GM/50ML IV SOLN
2.0000 g | Freq: Once | INTRAVENOUS | Status: AC
  Administered 2016-02-01: 2 g via INTRAVENOUS
  Filled 2016-02-01: qty 50

## 2016-02-01 MED ORDER — HYDRALAZINE HCL 20 MG/ML IJ SOLN: 10.0000 mg | INTRAMUSCULAR | Status: DC | PRN

## 2016-02-01 MED ORDER — ONDANSETRON HCL 4 MG/2ML IJ SOLN: 4.0000 mg | Freq: Four times a day (QID) | INTRAMUSCULAR | Status: DC | PRN

## 2016-02-01 MED ORDER — CIPROFLOXACIN HCL 500 MG PO TABS
500.0000 mg | ORAL_TABLET | Freq: Two times a day (BID) | ORAL | Status: DC
  Administered 2016-02-01 – 2016-02-03 (×4): 500 mg via ORAL
  Filled 2016-02-01 (×6): qty 1

## 2016-02-01 MED ORDER — SODIUM CHLORIDE 0.9% FLUSH
3.0000 mL | Freq: Two times a day (BID) | INTRAVENOUS | Status: DC
  Administered 2016-02-02 – 2016-02-03 (×3): 3 mL via INTRAVENOUS

## 2016-02-01 MED ORDER — HYDROCORTISONE ACETATE 25 MG RE SUPP
25.0000 mg | Freq: Every evening | RECTAL | Status: DC | PRN
  Filled 2016-02-01: qty 1

## 2016-02-01 MED ORDER — HYDROCODONE-ACETAMINOPHEN 5-325 MG PO TABS
1.0000 | ORAL_TABLET | ORAL | Status: DC | PRN
  Administered 2016-02-02 (×3): 1 via ORAL
  Filled 2016-02-01 (×3): qty 1

## 2016-02-01 NOTE — ED Notes (Signed)
Report given to Paul RN with [facility]. 

## 2016-02-01 NOTE — ED Notes (Signed)
Pt care turned over to [facility] transport. Report called to [employer] on [place].

## 2016-02-01 NOTE — ED Notes (Signed)
CMS intact before and after. Pt tolerated well. All questions answered and given to daughter as well.

## 2016-02-01 NOTE — ED Notes (Addendum)
Attempt to call report to [place]. Unable to take report at present. Phone number left with Network engineer.

## 2016-02-01 NOTE — ED Notes (Signed)
Attempt to call report to [place]. Unable to take report at this time.

## 2016-02-01 NOTE — ED Provider Notes (Addendum)
CSN: [id number]     Arrival date & time 02/01/16  1340 History   First MD Initiated Contact with Patient 02/01/16 1347     Chief Complaint  Patient presents with  . Fall  . Loss of Consciousness     (Consider location/radiation/quality/duration/timing/severity/associated sxs/prior Treatment) HPI 82 year old female who presents with fall. She has a history of hypertension and early dementia. Lives in assisted living facility. History primarily provided by patient's daughter who states that yesterday she was called by the patient asking for help she she was unable to get up after a fall. Patient does not quite remember, but thinks she may have lost balance. It is unsure if she hit her head or have loss of consciousness. Patient was assisted by facility staff members. Today, her daughter took her to the primary care doctor's office for checkup she was complaining of some left wrist pain. I spoke to the nurse practitioner, who reported that she would like to send her to the ED for evaluation due to a near syncopal episode in the setting of positive orthostatic vital signs. She reports that she was checking orthostatic vital signs when her blood pressure dropped from 100s SBP to 90s SBP to 80/60 from lying to sitting to standing. She became very symptomatic during this, complained of lightheadedness, and subsequently became unresponsive. She was awake, but was staring off, having some tremoring in her extremities. Her daughter does say that she had very slow and hesitant speech during this and appeared pale. She did not have any associating chest pain, difficulty breathing or palpitations. Denies any recent illnesses including fevers, cough, nausea vomiting, diarrhea. She denies headache, neck pain or back pain, abdominal pain, chest pain, numbness or weakness. Her daughter reports that she has been in her usual state of health, but normally drinks very little water during the daytime.   Past Medical  History  Diagnosis Date  . Osteoporosis   . Hypertension   . Transient memory loss 04/07/2011  . Right knee pain 10/20/2012  . Dermatitis, seborrheic 05/19/2013  . UTI (urinary tract infection) 05/19/2013  . Urinary tract infection, site not specified 05/19/2013  . Medicare annual wellness visit, subsequent 04/05/2014  . Hemorrhoids 11/15/2014  . Benign skin cyst 11/08/2015   Past Surgical History  Procedure Laterality Date  . Foeign body removal Left 10/20/2014    sewing needle removed from left foot  . Finger sugery Right    Family History  Problem Relation Age of Onset  . Diabetes      family hx  . Mental illness Brother     suicide  . Diabetes Paternal Grandmother   . Diabetes Paternal Grandfather   . Diabetes Sister    Social History  Substance Use Topics  . Smoking status: Never Smoker   . Smokeless tobacco: Never Used  . Alcohol Use: 0.0 oz/week    0 Standard drinks or equivalent per week   OB History    No data available     Review of Systems 10/14 systems reviewed and are negative other than those stated in the HPI    Allergies  Aricept and Penicillins  Home Medications   Prior to Admission medications   Medication Sig Start Date End Date Taking? Authorizing Provider  acetaminophen (TYLENOL) 500 MG tablet 1 tab po twice daily for pain 04/06/11   Mosie Lukes, MD  atenolol (TENORMIN) 50 MG tablet Take 1 tablet (50 mg total) by mouth daily. 11/08/15   Mosie Lukes, MD  citalopram (CELEXA) 20 MG tablet Take 1 tablet (20 mg total) by mouth daily. 11/08/15   Mosie Lukes, MD  diclofenac sodium (VOLTAREN) 1 % GEL Apply 1 application topically 2 (two) times daily as needed. 04/22/15   Historical Provider, MD  hydrocortisone (ANUSOL-HC) 25 MG suppository Place 1 suppository (25 mg total) rectally at bedtime as needed for hemorrhoids or itching. 11/10/14   Mosie Lukes, MD  Loperamide HCl (IMODIUM PO) Take by mouth as needed.    Historical Provider, MD  Probiotic  Product (PROBIOTIC DAILY) CAPS Take by mouth daily.    Historical Provider, MD  psyllium (METAMUCIL) 58.6 % packet Take 1 packet by mouth daily.    Historical Provider, MD  simethicone (MYLICON) 0000000 MG chewable tablet Chew 125 mg by mouth every 6 (six) hours as needed for flatulence.    Historical Provider, MD  VITAMIN D, CHOLECALCIFEROL, PO Take 1 tablet by mouth daily.      Historical Provider, MD  zinc gluconate 50 MG tablet Take 50 mg by mouth daily.    Historical Provider, MD   BP 118/57 mmHg  Pulse 60  Temp(Src) 98.6 F (37 C) (Oral)  Resp 16  Ht 5\' 2"  (1.575 m)  Wt 132 lb (59.875 kg)  BMI 24.14 kg/m2  SpO2 95% Physical Exam Physical Exam  Nursing note and vitals reviewed. Constitutional: Well developed, well nourished, elderly woman, non-toxic, and in no acute distress Head: Normocephalic and atraumatic.  Mouth/Throat: Oropharynx is clear and mildly dry mucous membranes. Neck: Normal range of motion. Neck supple. No cervical spine tenderness. Cardiovascular: Normal rate and regular rhythm.  No edema. Pulmonary/Chest: Effort normal and breath sounds normal.  Abdominal: Soft. There is no tenderness. There is no rebound and no guarding.  Musculoskeletal: Normal range of motion.  Neurological: Alert, no facial droop, fluent speech, moves all extremities symmetrically Skin: Skin is warm and dry.  Psychiatric: Cooperative  ED Course  Procedures (including critical care time) Labs Review Labs Reviewed  URINALYSIS, ROUTINE W REFLEX MICROSCOPIC (NOT AT [facility]) - Abnormal; Notable for the following:    Ketones, ur 15 (*)    Leukocytes, UA SMALL (*)    All other components within normal limits  CBC WITH DIFFERENTIAL/PLATELET - Abnormal; Notable for the following:    Hemoglobin 11.6 (*)    HCT 35.5 (*)    All other components within normal limits  COMPREHENSIVE METABOLIC PANEL - Abnormal; Notable for the following:    Glucose, Bld 117 (*)    BUN 28 (*)    ALT 12 (*)    All  other components within normal limits  MAGNESIUM - Abnormal; Notable for the following:    Magnesium 1.6 (*)    All other components within normal limits  URINE MICROSCOPIC-ADD ON - Abnormal; Notable for the following:    Squamous Epithelial / LPF 6-30 (*)    Bacteria, UA MANY (*)    All other components within normal limits  URINE CULTURE    Imaging Review Dg Chest 2 View  02/01/2016  CLINICAL DATA:  Unwitnessed fall, swelling in LEFT hand EXAM: CHEST  2 VIEW COMPARISON:  None. FINDINGS: Normal cardiac silhouette. Ectatic aorta. Central venous pulmonary congestion present. No effusion, infiltrate pneumothorax. IMPRESSION: Central venous congestion and ectatic aorta.  No acute findings. Electronically Signed   By: Suzy Bouchard M.D.   On: 02/01/2016 15:14   Dg Wrist Complete Left  02/01/2016  CLINICAL DATA:  Unwitnessed fall yesterday, LEFT wrist pain, LEFT hand swelling,  initial encounter EXAM: LEFT WRIST - COMPLETE 3+ VIEW COMPARISON:  None FINDINGS: Marked osseous demineralization. Joint space narrowing at first [facility] joint and STT joint. Subchondral cystic change at ulnar margin of distal radius adjacent to distal radioulnar joint. Extensive chondrocalcinosis question CPPD. Nondisplaced distal radial metaphyseal fracture without articular extension. No additional fracture, dislocation, or bone destruction. Significant soft tissue swelling at wrist. IMPRESSION: Nondisplaced distal LEFT radial metaphyseal fracture. Osseous demineralization with scattered degenerative changes and suspected CPPD. Electronically Signed   By: Lavonia Dana M.D.   On: 02/01/2016 15:13   Ct Head Wo Contrast  02/01/2016  CLINICAL DATA:  Status post fall yesterday. Patient had a seizure at the doctor's office today. EXAM: CT HEAD WITHOUT CONTRAST CT CERVICAL SPINE WITHOUT CONTRAST TECHNIQUE: Multidetector CT imaging of the head and cervical spine was performed following the standard protocol without intravenous contrast.  Multiplanar CT image reconstructions of the cervical spine were also generated. COMPARISON:  None. FINDINGS: CT HEAD FINDINGS There is chronic diffuse atrophy. Chronic bilateral periventricular white matter small vessel ischemic changes are noted. There is no midline shift, hydrocephalus, or mass. No acute hemorrhage or acute transcortical infarct is identified. The bony calvarium is intact. Minimal mucoperiosteal thickening of bilateral ethmoid and maxillary sinuses are identified. CT CERVICAL SPINE FINDINGS There is no acute fracture or dislocation. Degenerative joint changes of the spine are identified. The prevertebral soft tissues are normal. There is no malalignment. There is scarring of the lung apices. IMPRESSION: No focal acute intracranial abnormality identified. No acute fracture or dislocation of cervical spine. Chronic diffuse atrophy and chronic bilateral periventricular white matter small vessel ischemic change. Electronically Signed   By: Abelardo Diesel M.D.   On: 02/01/2016 15:04   Ct Cervical Spine Wo Contrast  02/01/2016  CLINICAL DATA:  Status post fall yesterday. Patient had a seizure at the doctor's office today. EXAM: CT HEAD WITHOUT CONTRAST CT CERVICAL SPINE WITHOUT CONTRAST TECHNIQUE: Multidetector CT imaging of the head and cervical spine was performed following the standard protocol without intravenous contrast. Multiplanar CT image reconstructions of the cervical spine were also generated. COMPARISON:  None. FINDINGS: CT HEAD FINDINGS There is chronic diffuse atrophy. Chronic bilateral periventricular white matter small vessel ischemic changes are noted. There is no midline shift, hydrocephalus, or mass. No acute hemorrhage or acute transcortical infarct is identified. The bony calvarium is intact. Minimal mucoperiosteal thickening of bilateral ethmoid and maxillary sinuses are identified. CT CERVICAL SPINE FINDINGS There is no acute fracture or dislocation. Degenerative joint changes  of the spine are identified. The prevertebral soft tissues are normal. There is no malalignment. There is scarring of the lung apices. IMPRESSION: No focal acute intracranial abnormality identified. No acute fracture or dislocation of cervical spine. Chronic diffuse atrophy and chronic bilateral periventricular white matter small vessel ischemic change. Electronically Signed   By: Abelardo Diesel M.D.   On: 02/01/2016 15:04   I have personally reviewed and evaluated these images and lab results as part of my medical decision-making.   EKG Interpretation   Date/Time:  Tuesday February 01 2016 13:54:48 EDT Ventricular Rate:  58 PR Interval:  137 QRS Duration: 94 QT Interval:  433 QTC Calculation: 425 R Axis:   -21 Text Interpretation:  Sinus rhythm Borderline left axis deviation Abnormal  R-wave progression, early transition No prior EKG for comparison. No acute  ischemic changes. No stigmata of arrhythmia.  Confirmed by LIU MD, Hinton Dyer  [phone]) on 02/01/2016 2:10:30 PM      MDM  Final diagnoses:  Orthostatic hypotension  Distal radius fracture, left, closed, initial encounter   82 year old female with history of dementia and hypertension who presents with unwitnessed fall and orthostatic hypotension. She is at baseline mental status on presentation, is hemodynamically stable. It with mild left wrist swelling and pain and limited range of motion. No other traumatic injuries noted on exam. Has unremarkable cardiopulmonary exam. CT head and cervical spine are negative for traumatic injuries. Her x-ray of the left wrist does suggest a left nondisplaced distal radius fracture. Hand is neurovascularly intact and splint was applied. Mild hypomagnesemia on blood work but no other major electrolyte or metabolic derangements. given 2 g of magnesium sulfate. Patient lives in senior apartment, but ambulates with walker which she is unable to do with splint. Additional, VS improved after 1L of fluids, but still  have positive orthostatic vital signs, hr 60s to 100s with standing. Will plan for admission for fluid rehydration and placement in nursing facility.  Patient with two daughters, one going out of town. Requests that Chevis Pretty be contacted prior to discharge [place], MD 02/01/16 [place] Liu, MD 02/01/16 1630

## 2016-02-01 NOTE — ED Notes (Signed)
She fell yesterday at the assisted living facility where she lives. Fall was unwitnessed. Her daughter states she may have had a seizure while in the doctors office today. She has an injury to her hand with bruising and swelling.

## 2016-02-01 NOTE — Patient Instructions (Signed)
Please complete x-ray on the first floor.  

## 2016-02-01 NOTE — ED Notes (Signed)
Pt wedding ring on left hand side of injury. Removed without difficulty with ky jelly. Pt tolerated well. Ring given to daughter and daughter also took walker home with her.

## 2016-02-01 NOTE — Progress Notes (Signed)
Pre visit review using our clinic review tool, if applicable. No additional management support is needed unless otherwise documented below in the visit note. 

## 2016-02-01 NOTE — ED Notes (Signed)
[facility] here to transport pt to [facility] °

## 2016-02-01 NOTE — ED Provider Notes (Signed)
5:30 PM patient stable. Resting comfortably. Stable for transfer to [facility] is accepting physician.  Orlie Dakin, MD 02/01/16 [phone]

## 2016-02-01 NOTE — H&P (Signed)
History and Physical    Sheryl Lang [id number] DOB: 1921/03/03 DOA: 02/01/2016  PCP: Vickki Hearing, MD   Patient coming from: ALF  Chief Complaint: Syncope   HPI: Sheryl Lang is a 82 y.o. female with medical history significant for dementia and hypertension who presents to the ED following a syncopal episode in her PCP office. Patient resides at an assisted living facility and had been in her usual state until suffering an unwitnessed fall on 01/31/2016. She was noted to have pain and swelling in her left hand and wrist following this and was evaluated this morning by her PCP. Orthostatic vital signs were measured at the PCP clinic with drop in SBP from 100 to 80 upon standing. Patient became briefly unresponsive upon standing quickly regained consciousness when returning to a seated position. She was directed to the emergency department for further evaluation of this.  ED Course: Upon arrival to the ED, patient is found to be afebrile, saturating well on room air, and with vital signs stable. EKG features a sinus rhythm with early R-wave transition and chest x-ray is notable for some central venous pulmonary congestion but no acute changes. CT of the head and cervical spine are without acute abnormalities. Radiographs of the left wrist demonstrate a nondisplaced left radial metaphyseal fracture and findings suggestive of CCPD. Chemistry panel features a markedly elevated BUN to creatinine ratio and magnesium level of 1.6. CBC is notable for a hemoglobin of 11.6 with normal MCV, apparently stable relative to prior measurements. Urine was sent for analysis and suggestive of infection. A 1 L normal saline bolus was administered in the emergency department, urine cultures were obtained, and the patient was treated empiric ciprofloxacin by mouth. She remained hemodynamically stable in the emergency department and reported some subjective improvement following fluid bolus. She'll be admitted to the  telemetry unit for ongoing evaluation and management of a syncopal episode and acute nondisplaced left wrist fracture.  Review of Systems:  All other systems reviewed and apart from HPI, are negative.  Past Medical History  Diagnosis Date  . Osteoporosis   . Hypertension   . Transient memory loss 04/07/2011  . Right knee pain 10/20/2012  . Dermatitis, seborrheic 05/19/2013  . UTI (urinary tract infection) 05/19/2013  . Urinary tract infection, site not specified 05/19/2013  . Medicare annual wellness visit, subsequent 04/05/2014  . Hemorrhoids 11/15/2014  . Benign skin cyst 11/08/2015    Past Surgical History  Procedure Laterality Date  . Foeign body removal Left 10/20/2014    sewing needle removed from left foot  . Finger sugery Right      reports that she has never smoked. She has never used smokeless tobacco. She reports that she drinks alcohol. Her drug history is not on file.  Allergies  Allergen Reactions  . Aricept [Donepezil Hcl] Other (See Comments)    Increased confusion  . Penicillins     Family History  Problem Relation Age of Onset  . Diabetes      family hx  . Mental illness Brother     suicide  . Diabetes Paternal Grandmother   . Diabetes Paternal Grandfather   . Diabetes Sister      Prior to Admission medications   Medication Sig Start Date End Date Taking? Authorizing Provider  acetaminophen (TYLENOL) 500 MG tablet 1 tab po twice daily for pain 04/06/11   Mosie Lukes, MD  atenolol (TENORMIN) 50 MG tablet Take 1 tablet (50 mg total) by mouth daily. 11/08/15  Mosie Lukes, MD  citalopram (CELEXA) 20 MG tablet Take 1 tablet (20 mg total) by mouth daily. 11/08/15   Mosie Lukes, MD  diclofenac sodium (VOLTAREN) 1 % GEL Apply 1 application topically 2 (two) times daily as needed. 04/22/15   Historical Provider, MD  hydrocortisone (ANUSOL-HC) 25 MG suppository Place 1 suppository (25 mg total) rectally at bedtime as needed for hemorrhoids or itching. 11/10/14    Mosie Lukes, MD  Loperamide HCl (IMODIUM PO) Take by mouth as needed.    Historical Provider, MD  Probiotic Product (PROBIOTIC DAILY) CAPS Take by mouth daily.    Historical Provider, MD  psyllium (METAMUCIL) 58.6 % packet Take 1 packet by mouth daily.    Historical Provider, MD  simethicone (MYLICON) 0000000 MG chewable tablet Chew 125 mg by mouth every 6 (six) hours as needed for flatulence.    Historical Provider, MD  VITAMIN D, CHOLECALCIFEROL, PO Take 1 tablet by mouth daily.      Historical Provider, MD  zinc gluconate 50 MG tablet Take 50 mg by mouth daily.    Historical Provider, MD    Physical Exam: Filed Vitals:   02/01/16 1630 02/01/16 1704 02/01/16 1730 02/01/16 1904  BP: 140/90 116/64 103/82 158/92  Pulse: 89 72 68 84  Temp:    98.7 F (37.1 C)  TempSrc:    Oral  Resp: 12 18 19 18   Height:    5\' 2"  (1.575 m)  Weight:    63.1 kg (139 lb 1.8 oz)  SpO2: 98% 100% 98% 99%      Constitutional: NAD, calm, comfortable Eyes: PERTLA, lids and conjunctivae normal ENMT: Mucous membranes are moist. Posterior pharynx clear of any exudate or lesions.   Neck: normal, supple, no masses, no thyromegaly Respiratory: clear to auscultation bilaterally, no wheezing, no crackles. Normal respiratory effort. No accessory muscle use.  Cardiovascular: S1 & S2 heard, regular rate and rhythm. No carotid bruits. No significant JVD. Abdomen: No distension, no tenderness, no masses palpated. Bowel sounds normal.  Musculoskeletal: no clubbing / cyanosis. No joint deformity upper and lower extremities. Normal muscle tone.  Skin: no significant rashes, lesions, ulcers. Warm, dry, well-perfused. Neurologic: CN 2-12 grossly intact. Sensation intact, DTR normal. Strength 5/5 in all 4 limbs.  Psychiatric:  Alert, oriented to person and place only. Normal mood and affect.     Labs on Admission: I have personally reviewed following labs and imaging studies  CBC:  Recent Labs Lab 02/01/16 1410  WBC  7.4  NEUTROABS 5.6  HGB 11.6*  HCT 35.5*  MCV 87.7  PLT [id number]   Basic Metabolic Panel:  Recent Labs Lab 02/01/16 1410  NA 138  K 4.0  CL 103  CO2 27  GLUCOSE 117*  BUN 28*  CREATININE 0.76  CALCIUM 9.2  MG 1.6*   GFR: Estimated Creatinine Clearance: 38.4 mL/min (by C-G formula based on Cr of 0.76). Liver Function Tests:  Recent Labs Lab 02/01/16 1410  AST 22  ALT 12*  ALKPHOS 74  BILITOT 0.8  PROT 6.8  ALBUMIN 3.7   No results for input(s): LIPASE, AMYLASE in the last 168 hours. No results for input(s): AMMONIA in the last 168 hours. Coagulation Profile: No results for input(s): INR, PROTIME in the last 168 hours. Cardiac Enzymes: No results for input(s): CKTOTAL, CKMB, CKMBINDEX, TROPONINI in the last 168 hours. BNP (last 3 results) No results for input(s): PROBNP in the last 8760 hours. HbA1C: No results for input(s): HGBA1C in the last  72 hours. CBG: No results for input(s): GLUCAP in the last 168 hours. Lipid Profile: No results for input(s): CHOL, HDL, LDLCALC, TRIG, CHOLHDL, LDLDIRECT in the last 72 hours. Thyroid Function Tests: No results for input(s): TSH, T4TOTAL, FREET4, T3FREE, THYROIDAB in the last 72 hours. Anemia Panel: No results for input(s): VITAMINB12, FOLATE, FERRITIN, TIBC, IRON, RETICCTPCT in the last 72 hours. Urine analysis:    Component Value Date/Time   COLORURINE YELLOW 02/01/2016 [place] 02/01/2016 1540   LABSPEC 1.012 02/01/2016 1540   PHURINE 7.0 02/01/2016 1540   GLUCOSEU NEGATIVE 02/01/2016 1540   GLUCOSEU NEGATIVE 05/10/2015 1540   HGBUR NEGATIVE 02/01/2016 1540   BILIRUBINUR NEGATIVE 02/01/2016 1540   BILIRUBINUR negative 11/19/2013 1131   KETONESUR 15* 02/01/2016 1540   PROTEINUR NEGATIVE 02/01/2016 1540   PROTEINUR negative 11/19/2013 1131   UROBILINOGEN 1.0 05/10/2015 1540   UROBILINOGEN 0.2 11/19/2013 1131   NITRITE NEGATIVE 02/01/2016 1540   NITRITE negative 11/19/2013 1131   LEUKOCYTESUR  SMALL* 02/01/2016 1540   Sepsis Labs: @LABRCNTIP (procalcitonin:4,lacticidven:4) )No results found for this or any previous visit (from the past 240 hour(s)).   Radiological Exams on Admission: Dg Chest 2 View  02/01/2016  CLINICAL DATA:  Unwitnessed fall, swelling in LEFT hand EXAM: CHEST  2 VIEW COMPARISON:  None. FINDINGS: Normal cardiac silhouette. Ectatic aorta. Central venous pulmonary congestion present. No effusion, infiltrate pneumothorax. IMPRESSION: Central venous congestion and ectatic aorta.  No acute findings. Electronically Signed   By: Suzy Bouchard M.D.   On: 02/01/2016 15:14   Dg Wrist Complete Left  02/01/2016  CLINICAL DATA:  Unwitnessed fall yesterday, LEFT wrist pain, LEFT hand swelling, initial encounter EXAM: LEFT WRIST - COMPLETE 3+ VIEW COMPARISON:  None FINDINGS: Marked osseous demineralization. Joint space narrowing at first [facility] joint and STT joint. Subchondral cystic change at ulnar margin of distal radius adjacent to distal radioulnar joint. Extensive chondrocalcinosis question CPPD. Nondisplaced distal radial metaphyseal fracture without articular extension. No additional fracture, dislocation, or bone destruction. Significant soft tissue swelling at wrist. IMPRESSION: Nondisplaced distal LEFT radial metaphyseal fracture. Osseous demineralization with scattered degenerative changes and suspected CPPD. Electronically Signed   By: Lavonia Dana M.D.   On: 02/01/2016 15:13   Ct Head Wo Contrast  02/01/2016  CLINICAL DATA:  Status post fall yesterday. Patient had a seizure at the doctor's office today. EXAM: CT HEAD WITHOUT CONTRAST CT CERVICAL SPINE WITHOUT CONTRAST TECHNIQUE: Multidetector CT imaging of the head and cervical spine was performed following the standard protocol without intravenous contrast. Multiplanar CT image reconstructions of the cervical spine were also generated. COMPARISON:  None. FINDINGS: CT HEAD FINDINGS There is chronic diffuse atrophy. Chronic  bilateral periventricular white matter small vessel ischemic changes are noted. There is no midline shift, hydrocephalus, or mass. No acute hemorrhage or acute transcortical infarct is identified. The bony calvarium is intact. Minimal mucoperiosteal thickening of bilateral ethmoid and maxillary sinuses are identified. CT CERVICAL SPINE FINDINGS There is no acute fracture or dislocation. Degenerative joint changes of the spine are identified. The prevertebral soft tissues are normal. There is no malalignment. There is scarring of the lung apices. IMPRESSION: No focal acute intracranial abnormality identified. No acute fracture or dislocation of cervical spine. Chronic diffuse atrophy and chronic bilateral periventricular white matter small vessel ischemic change. Electronically Signed   By: Abelardo Diesel M.D.   On: 02/01/2016 15:04   Ct Cervical Spine Wo Contrast  02/01/2016  CLINICAL DATA:  Status post fall yesterday. Patient had a  seizure at the doctor's office today. EXAM: CT HEAD WITHOUT CONTRAST CT CERVICAL SPINE WITHOUT CONTRAST TECHNIQUE: Multidetector CT imaging of the head and cervical spine was performed following the standard protocol without intravenous contrast. Multiplanar CT image reconstructions of the cervical spine were also generated. COMPARISON:  None. FINDINGS: CT HEAD FINDINGS There is chronic diffuse atrophy. Chronic bilateral periventricular white matter small vessel ischemic changes are noted. There is no midline shift, hydrocephalus, or mass. No acute hemorrhage or acute transcortical infarct is identified. The bony calvarium is intact. Minimal mucoperiosteal thickening of bilateral ethmoid and maxillary sinuses are identified. CT CERVICAL SPINE FINDINGS There is no acute fracture or dislocation. Degenerative joint changes of the spine are identified. The prevertebral soft tissues are normal. There is no malalignment. There is scarring of the lung apices. IMPRESSION: No focal acute  intracranial abnormality identified. No acute fracture or dislocation of cervical spine. Chronic diffuse atrophy and chronic bilateral periventricular white matter small vessel ischemic change. Electronically Signed   By: Abelardo Diesel M.D.   On: 02/01/2016 15:04    EKG: Independently reviewed. Sinus rhythm, early R-wave transition   Assessment/Plan  1. Orthostatic hypotension with syncope  - Dehydration/hypovolemia likely contributing with BUN:SCr ratio of 37 - Continue IVF hydration  - Monitor on telemetry  - Consider further evaluation with TTE    2. Left distal radius fracture  - Nondisplaced left radial metaphyseal fracture noted on radiographs  - Splinted in ED; pain is well-controlled; left hand neurovascularly intact  - Orthopedic consultation can be considered    3. UTI  - UA is suggestive of infection; given the patient's dementia, presence of symptoms is difficult to ascertain  - Treating empirically with Cipro while awaiting culture results    4. Hypertension  - BP elevated beyond goal in ED - Managed with atenolol at home - Atenolol held in light of orthostatic hypotension with syncope  - Treat with hydralazine IVP's prn for now   5. Normocytic anemia  - Hgb 11.6 on admission and stable relative to prior measurements  - No sign of active blood-loss    DVT prophylaxis: sq Lovenox  Code Status: Full   Family Communication: Daughter updated at bedside  Disposition Plan: Observe on telemetry  Consults called: None Admission status: Observation    Vianne Bulls, MD [facility] Pager [phone]  If 7PM-7AM, please contact night-coverage [email or web address] Password [facility]  02/01/2016, 7:36 PM

## 2016-02-01 NOTE — Progress Notes (Signed)
Subjective:    Patient ID: Sheryl Lang, female    DOB: Dec 23, 1920, 82 y.o.   MRN: [id number]  HPI  Sheryl Lang is a 82 yr old female who present today following a fall.  He reports that she fell yesterday (lost her balance).  She lives at [facility]. Pt has swelling of the left hand.  Fall was unwitnessed. She has some baseline memory loss so she is unable to provide any fall history but denies pain anywhere else.   Review of Systems See HPI  Past Medical History  Diagnosis Date  . Osteoporosis   . Hypertension   . Transient memory loss 04/07/2011  . Right knee pain 10/20/2012  . Dermatitis, seborrheic 05/19/2013  . UTI (urinary tract infection) 05/19/2013  . Urinary tract infection, site not specified 05/19/2013  . Medicare annual wellness visit, subsequent 04/05/2014  . Hemorrhoids 11/15/2014  . Benign skin cyst 11/08/2015     Social History   Social History  . Marital Status: Widowed    Spouse Name: N/A  . Number of Children: N/A  . Years of Education: N/A   Occupational History  . Not on file.   Social History Main Topics  . Smoking status: Never Smoker   . Smokeless tobacco: Never Used  . Alcohol Use: 0.0 oz/week    0 Standard drinks or equivalent per week  . Drug Use: Not on file  . Sexual Activity: No     Comment: lives at [facility]   Other Topics Concern  . Not on file   Social History Narrative    Past Surgical History  Procedure Laterality Date  . Foeign body removal Left 10/20/2014    sewing needle removed from left foot  . Finger sugery Right     Family History  Problem Relation Age of Onset  . Diabetes      family hx  . Mental illness Brother     suicide  . Diabetes Paternal Grandmother   . Diabetes Paternal Grandfather   . Diabetes Sister     Allergies  Allergen Reactions  . Aricept [Donepezil Hcl] Other (See Comments)    Increased confusion  . Penicillins     Current Outpatient Prescriptions on File Prior to Visit  Medication Sig  Dispense Refill  . acetaminophen (TYLENOL) 500 MG tablet 1 tab po twice daily for pain 30 tablet 0  . atenolol (TENORMIN) 50 MG tablet Take 1 tablet (50 mg total) by mouth daily. 90 tablet 2  . citalopram (CELEXA) 20 MG tablet Take 1 tablet (20 mg total) by mouth daily. 90 tablet 2  . diclofenac sodium (VOLTAREN) 1 % GEL Apply 1 application topically 2 (two) times daily as needed.  0  . hydrocortisone (ANUSOL-HC) 25 MG suppository Place 1 suppository (25 mg total) rectally at bedtime as needed for hemorrhoids or itching. 12 suppository 2  . Loperamide HCl (IMODIUM PO) Take by mouth as needed.    . Probiotic Product (PROBIOTIC DAILY) CAPS Take by mouth daily.    . psyllium (METAMUCIL) 58.6 % packet Take 1 packet by mouth daily.    . simethicone (MYLICON) 0000000 MG chewable tablet Chew 125 mg by mouth every 6 (six) hours as needed for flatulence.    Marland Kitchen VITAMIN D, CHOLECALCIFEROL, PO Take 1 tablet by mouth daily.      Marland Kitchen zinc gluconate 50 MG tablet Take 50 mg by mouth daily.     No current facility-administered medications on file prior to visit.  BP 112/70 mmHg  Pulse 84  Temp(Src) 98.5 F (36.9 C) (Oral)  Resp 16  Ht 5\' 2"  (1.575 m)  Wt 132 lb (59.875 kg)  BMI 24.14 kg/m2  SpO2 99%       Objective:   Physical Exam  Constitutional: She appears well-developed and well-nourished.  Cardiovascular: Normal rate, regular rhythm and normal heart sounds.   No murmur heard. Pulmonary/Chest: Effort normal and breath sounds normal. No respiratory distress. She has no wheezes.  Musculoskeletal:  + swelling left dorsal hand/eccymosis  Psychiatric: She has a normal mood and affect. Her behavior is normal. Judgment and thought content normal.          Assessment & Plan:  Swelling of left hand- will obtain x ray to rule out fracture. Pt has an ortho at [facility].   Syncope- occurred during orthostatic check today.  Orthostatics as follows  Laying bp  100/70  HR 70 Sitting bp  90/68    pulse 85 Standing bp  80/60   hr 83  Pt had brief episode of unresponsiveness which occurred with standing.  She became briefly unresponsive.  Then regained Speech was mumbled. Had some lower extremity trembling briefly.+ pallor and diaphoresis. Oxygen sat was 92% after this event, pulse was palpable but faint with HR of 55.  Report given to Dr. Oleta Mouse physician at the [facility] ED.  Pt was wheeled down to ED by CMA for further evaluation.  Pt is maintained on atenolol which will likely need to be adjusted.

## 2016-02-02 ENCOUNTER — Observation Stay (HOSPITAL_BASED_OUTPATIENT_CLINIC_OR_DEPARTMENT_OTHER): Payer: Medicare Other

## 2016-02-02 DIAGNOSIS — R55 Syncope and collapse: Secondary | ICD-10-CM

## 2016-02-02 DIAGNOSIS — I10 Essential (primary) hypertension: Secondary | ICD-10-CM

## 2016-02-02 DIAGNOSIS — S62102A Fracture of unspecified carpal bone, left wrist, initial encounter for closed fracture: Secondary | ICD-10-CM

## 2016-02-02 DIAGNOSIS — N3000 Acute cystitis without hematuria: Secondary | ICD-10-CM | POA: Diagnosis not present

## 2016-02-02 DIAGNOSIS — I951 Orthostatic hypotension: Secondary | ICD-10-CM | POA: Diagnosis not present

## 2016-02-02 DIAGNOSIS — S52509A Unspecified fracture of the lower end of unspecified radius, initial encounter for closed fracture: Secondary | ICD-10-CM | POA: Insufficient documentation

## 2016-02-02 LAB — BASIC METABOLIC PANEL
ANION GAP: 8 (ref 5–15)
BUN: 22 mg/dL — ABNORMAL HIGH (ref 6–20)
CALCIUM: 8.4 mg/dL — AB (ref 8.9–10.3)
CO2: 23 mmol/L (ref 22–32)
CREATININE: 0.68 mg/dL (ref 0.44–1.00)
Chloride: 107 mmol/L (ref 101–111)
GFR calc Af Amer: >60 mL/min (ref >60)
Glucose, Bld: 104 mg/dL — ABNORMAL HIGH (ref 65–99)
Potassium: 3.4 mmol/L — ABNORMAL LOW (ref 3.5–5.1)
Sodium: 138 mmol/L (ref 135–145)

## 2016-02-02 LAB — ECHOCARDIOGRAM COMPLETE
CHL CUP MV DEC (S): 158
EWDT: 158 ms
FS: 42 % (ref 28–44)
HEIGHTINCHES: 62 in
IVS/LV PW RATIO, ED: 1.02
LA ID, A-P, ES: 41 mm
LA vol A4C: 56.5 ml
LA vol: 54.1 mL
LADIAMINDEX: 2.53 cm/m2
LAVOLIN: 33.4 mL/m2
LEFT ATRIUM END SYS DIAM: 41 mm
LV PW d: 12.7 mm — AB (ref 0.6–1.1)
MV Peak grad: 3 mmHg
MV pk A vel: 128 m/s
MV pk E vel: 86.8 m/s
RV TAPSE: 14.8 mm
Weight: 2155.22 oz

## 2016-02-02 LAB — GLUCOSE, CAPILLARY
GLUCOSE-CAPILLARY: 198 mg/dL — AB (ref 65–99)
Glucose-Capillary: 111 mg/dL — ABNORMAL HIGH (ref 65–99)

## 2016-02-02 MED ORDER — POTASSIUM CHLORIDE CRYS ER 20 MEQ PO TBCR
40.0000 meq | EXTENDED_RELEASE_TABLET | Freq: Once | ORAL | Status: AC
  Administered 2016-02-02: 40 meq via ORAL
  Filled 2016-02-02: qty 2

## 2016-02-02 NOTE — Progress Notes (Signed)
Patient intermittently pulling off sling and ace wrap throughout the night. Mitten applied. Pt states sling is uncomfortable. Sling off at this time, arm resting up on pillow.

## 2016-02-02 NOTE — Progress Notes (Signed)
*  PRELIMINARY RESULTS* Echocardiogram 2D Echocardiogram has been performed.  Leavy Cella 02/02/2016, 3:08 PM

## 2016-02-02 NOTE — Progress Notes (Signed)
Patient pulled off sling and most of temporary cast. RN re-applied. Will report to MD.

## 2016-02-02 NOTE — Progress Notes (Signed)
PROGRESS NOTE        PATIENT DETAILS Name: Sheryl Lang Age: 82 y.o. Sex: female Date of Birth: 1921/02/10 Admit Date: 02/01/2016 Admitting Physician Vianne Bulls, MD [id number], ADAM, MD  Brief Narrative: Patient is a 82 y.o. female past medical history of hypertension, dementia who sustained a left wrist fracture following a syncopal episode.  Subjective: Mildly confused-but seems to answer all my questions appropriately. Mild pain at her left wrist but no other complaints.  Assessment/Plan: Principal Problem: Syncope: Suspect secondary to orthostatic hypotension. Telemetry negative, await echocardiogram. Hold beta blocker for now.  Active Problems: Orthostatic hypotension: Hold beta blocker, allow mild permissive hypertension thigh-high TED hose.  UTI: Very hard to discern whether she is symptomatic or not-given dementia.But would plan on at least 3 days of antibiotics.  Left wrist fracture: Secondary to syncopal episode.[place] findings discussed with Dr. Percell Miller over the phone, who advises a splint, and outpatient follow-up with him.  Hypokalemia: Replete and recheck  Dementia: Mildly confused, supportive care only.  DVT Prophylaxis: Prophylactic Lovenox   Code Status: Full code or DNR  Family Communication: Granddaughter at bedside  Disposition Plan: Remain inpatient- SNF when bed available   Antimicrobial agents: Ciprofloxacin 6/6>>  Procedures: None  CONSULTS: None  Time spent: 25 minutes-Greater than 50% of this time was spent in counseling, explanation of diagnosis, planning of further management, and coordination of care.  MEDICATIONS: Anti-infectives    Start     Dose/Rate Route Frequency Ordered Stop   02/01/16 2000  ciprofloxacin (CIPRO) tablet 500 mg     500 mg Oral 2 times daily 02/01/16 1625 02/04/16 1959      Scheduled Meds: . ciprofloxacin  500 mg Oral BID  . citalopram  20 mg Oral Daily  . enoxaparin  (LOVENOX) injection  40 mg Subcutaneous Q24H  . saccharomyces boulardii  250 mg Oral BID  . sodium chloride flush  3 mL Intravenous Q12H   Continuous Infusions:  PRN Meds:.acetaminophen **OR** acetaminophen, hydrALAZINE, HYDROcodone-acetaminophen, hydrocortisone, ondansetron **OR** ondansetron (ZOFRAN) IV   PHYSICAL EXAM: Vital signs: Filed Vitals:   02/02/16 0309 02/02/16 0553 02/02/16 1020 02/02/16 1418  BP:  143/89 97/59 133/71  Pulse:  91 94 99  Temp:  99 F (37.2 C) 98.4 F (36.9 C) 98.5 F (36.9 C)  TempSrc:  Oral Oral Oral  Resp:  20 19 20   Height:      Weight: 61.1 kg (134 lb 11.2 oz)     SpO2:  93% 94% 97%   Filed Weights   02/01/16 1345 02/01/16 1904 02/02/16 0309  Weight: 59.875 kg (132 lb) 63.1 kg (139 lb 1.8 oz) 61.1 kg (134 lb 11.2 oz)   Body mass index is 24.63 kg/(m^2).   Gen Exam: Awake, Pleasantly confused, clear speech. Not in any distress  Neck: Supple, No JVD.   Chest: B/L Clear.   CVS: S1 S2 Regular Abdomen: soft, BS +, non tender, non distended.  Extremities: no edema, lower extremities warm to touch. Left hand in splint, with good capillary refill in all her fingers. Neurologic: Non Focal.   Skin: No Rash or lesions   Wounds: N/A.    LABORATORY DATA: CBC:  Recent Labs Lab 02/01/16 1410  WBC 7.4  NEUTROABS 5.6  HGB 11.6*  HCT 35.5*  MCV 87.7  PLT [id number]    Basic Metabolic Panel:  Recent Labs Lab 02/01/16 1410 02/02/16 0541  NA 138 138  K 4.0 3.4*  CL 103 107  CO2 27 23  GLUCOSE 117* 104*  BUN 28* 22*  CREATININE 0.76 0.68  CALCIUM 9.2 8.4*  MG 1.6*  --     GFR: Estimated Creatinine Clearance: 37.8 mL/min (by C-G formula based on Cr of 0.68).  Liver Function Tests:  Recent Labs Lab 02/01/16 1410  AST 22  ALT 12*  ALKPHOS 74  BILITOT 0.8  PROT 6.8  ALBUMIN 3.7   No results for input(s): LIPASE, AMYLASE in the last 168 hours. No results for input(s): AMMONIA in the last 168 hours.  Coagulation Profile: No  results for input(s): INR, PROTIME in the last 168 hours.  Cardiac Enzymes:  Recent Labs Lab 02/01/16 1958  TROPONINI <0.03    BNP (last 3 results) No results for input(s): PROBNP in the last 8760 hours.  HbA1C: No results for input(s): HGBA1C in the last 72 hours.  CBG:  Recent Labs Lab 02/02/16 0737 02/02/16 1335  GLUCAP 111* 198*    Lipid Profile: No results for input(s): CHOL, HDL, LDLCALC, TRIG, CHOLHDL, LDLDIRECT in the last 72 hours.  Thyroid Function Tests: No results for input(s): TSH, T4TOTAL, FREET4, T3FREE, THYROIDAB in the last 72 hours.  Anemia Panel: No results for input(s): VITAMINB12, FOLATE, FERRITIN, TIBC, IRON, RETICCTPCT in the last 72 hours.  Urine analysis:    Component Value Date/Time   COLORURINE YELLOW 02/01/2016 [place] 02/01/2016 1540   LABSPEC 1.012 02/01/2016 1540   PHURINE 7.0 02/01/2016 1540   GLUCOSEU NEGATIVE 02/01/2016 1540   GLUCOSEU NEGATIVE 05/10/2015 1540   HGBUR NEGATIVE 02/01/2016 1540   BILIRUBINUR NEGATIVE 02/01/2016 1540   BILIRUBINUR negative 11/19/2013 1131   KETONESUR 15* 02/01/2016 1540   PROTEINUR NEGATIVE 02/01/2016 1540   PROTEINUR negative 11/19/2013 1131   UROBILINOGEN 1.0 05/10/2015 1540   UROBILINOGEN 0.2 11/19/2013 1131   NITRITE NEGATIVE 02/01/2016 1540   NITRITE negative 11/19/2013 1131   LEUKOCYTESUR SMALL* 02/01/2016 1540    Sepsis Labs: Lactic Acid, Venous No results found for: LATICACIDVEN  MICROBIOLOGY: Recent Results (from the past 240 hour(s))  MRSA PCR Screening     Status: None   Collection Time: 02/01/16  8:16 PM  Result Value Ref Range Status   MRSA by PCR NEGATIVE NEGATIVE Final    Comment:        The GeneXpert MRSA Assay (FDA approved for NASAL specimens only), is one component of a comprehensive MRSA colonization surveillance program. It is not intended to diagnose MRSA infection nor to guide or monitor treatment for MRSA infections.     RADIOLOGY  STUDIES/RESULTS: Dg Chest 2 View  02/01/2016  CLINICAL DATA:  Unwitnessed fall, swelling in LEFT hand EXAM: CHEST  2 VIEW COMPARISON:  None. FINDINGS: Normal cardiac silhouette. Ectatic aorta. Central venous pulmonary congestion present. No effusion, infiltrate pneumothorax. IMPRESSION: Central venous congestion and ectatic aorta.  No acute findings. Electronically Signed   By: Suzy Bouchard M.D.   On: 02/01/2016 15:14   Dg Wrist Complete Left  02/01/2016  CLINICAL DATA:  Unwitnessed fall yesterday, LEFT wrist pain, LEFT hand swelling, initial encounter EXAM: LEFT WRIST - COMPLETE 3+ VIEW COMPARISON:  None FINDINGS: Marked osseous demineralization. Joint space narrowing at first [facility] joint and STT joint. Subchondral cystic change at ulnar margin of distal radius adjacent to distal radioulnar joint. Extensive chondrocalcinosis question CPPD. Nondisplaced distal radial metaphyseal fracture without articular extension. No additional fracture, dislocation,  or bone destruction. Significant soft tissue swelling at wrist. IMPRESSION: Nondisplaced distal LEFT radial metaphyseal fracture. Osseous demineralization with scattered degenerative changes and suspected CPPD. Electronically Signed   By: Lavonia Dana M.D.   On: 02/01/2016 15:13   Ct Head Wo Contrast  02/01/2016  CLINICAL DATA:  Status post fall yesterday. Patient had a seizure at the doctor's office today. EXAM: CT HEAD WITHOUT CONTRAST CT CERVICAL SPINE WITHOUT CONTRAST TECHNIQUE: Multidetector CT imaging of the head and cervical spine was performed following the standard protocol without intravenous contrast. Multiplanar CT image reconstructions of the cervical spine were also generated. COMPARISON:  None. FINDINGS: CT HEAD FINDINGS There is chronic diffuse atrophy. Chronic bilateral periventricular white matter small vessel ischemic changes are noted. There is no midline shift, hydrocephalus, or mass. No acute hemorrhage or acute transcortical infarct is  identified. The bony calvarium is intact. Minimal mucoperiosteal thickening of bilateral ethmoid and maxillary sinuses are identified. CT CERVICAL SPINE FINDINGS There is no acute fracture or dislocation. Degenerative joint changes of the spine are identified. The prevertebral soft tissues are normal. There is no malalignment. There is scarring of the lung apices. IMPRESSION: No focal acute intracranial abnormality identified. No acute fracture or dislocation of cervical spine. Chronic diffuse atrophy and chronic bilateral periventricular white matter small vessel ischemic change. Electronically Signed   By: Abelardo Diesel M.D.   On: 02/01/2016 15:04   Ct Cervical Spine Wo Contrast  02/01/2016  CLINICAL DATA:  Status post fall yesterday. Patient had a seizure at the doctor's office today. EXAM: CT HEAD WITHOUT CONTRAST CT CERVICAL SPINE WITHOUT CONTRAST TECHNIQUE: Multidetector CT imaging of the head and cervical spine was performed following the standard protocol without intravenous contrast. Multiplanar CT image reconstructions of the cervical spine were also generated. COMPARISON:  None. FINDINGS: CT HEAD FINDINGS There is chronic diffuse atrophy. Chronic bilateral periventricular white matter small vessel ischemic changes are noted. There is no midline shift, hydrocephalus, or mass. No acute hemorrhage or acute transcortical infarct is identified. The bony calvarium is intact. Minimal mucoperiosteal thickening of bilateral ethmoid and maxillary sinuses are identified. CT CERVICAL SPINE FINDINGS There is no acute fracture or dislocation. Degenerative joint changes of the spine are identified. The prevertebral soft tissues are normal. There is no malalignment. There is scarring of the lung apices. IMPRESSION: No focal acute intracranial abnormality identified. No acute fracture or dislocation of cervical spine. Chronic diffuse atrophy and chronic bilateral periventricular white matter small vessel ischemic  change. Electronically Signed   By: Abelardo Diesel M.D.   On: 02/01/2016 15:04       Oren Binet, MD  [facility] Pager:[phone]  If 7PM-7AM, please contact night-coverage [email or web address] Password [email or web address] 02/02/2016, 3:02 PM

## 2016-02-02 NOTE — Evaluation (Signed)
Physical Therapy Evaluation Patient Details Name: Sheryl Lang MRN: [id number] DOB: 20-Jun-1921 Today's Date: 02/02/2016   History of Present Illness  82 yo female admitted with syncope, fall, L distal radial fx. Hx of HTN, dementia, osteoporosis.   Clinical Impression  On eval, pt required Min-Mod assist for mobility. Pt stood x2 but was unable to take any steps forward or laterally with only +1 assist. Pt remains at risk for falls. No family present during session. Pt reports she is in [place]. Do not feel pt is safe to return to her apartment alone. Recommend ST rehab at SNF.     Follow Up Recommendations SNF; 24 hour supervision/assist    Equipment Recommendations  None recommended by PT    Recommendations for Other Services       Precautions / Restrictions Precautions Precautions: Fall Required Braces or Orthoses: Sling (L arm) Restrictions Weight Bearing Restrictions: Yes LUE Weight Bearing: Non weight bearing      Mobility  Bed Mobility Overal bed mobility: Needs Assistance Bed Mobility: Supine to Sit;Sit to Supine     Supine to sit: Min assist Sit to supine: Mod assist   General bed mobility comments: Assist for trunk for supine>sit. Assist for bil LEs for sit>supine  Transfers Overall transfer level: Needs assistance Equipment used: Rolling walker (2 wheeled) Transfers: Sit to/from Stand Sit to Stand: Mod assist         General transfer comment: x 2 attempts. Assist to rise, stabilize, control descent. Pt very fearful of falling. She has difficulty keeping feet within safe BOS. Posterior bias. Pt was unable to take any steps.  Ambulation/Gait             General Gait Details: NT-unable to safely attempt with +1 assist  Stairs            Wheelchair Mobility    Modified Rankin (Stroke Patients Only)       Balance Overall balance assessment: Needs assistance         Standing balance support: During functional  activity Standing balance-Leahy Scale: Poor                               Pertinent Vitals/Pain Pain Assessment: Faces Faces Pain Scale: Hurts a little bit Pain Location: L arm/wrist Pain Descriptors / Indicators: Sore Pain Intervention(s): Repositioned    Home Living Family/patient expects to be discharged to:: Skilled nursing facility     Type of Home: Independent living facility Home Access: Level entry     Home Layout: One level Home Equipment: Walker - 2 wheels      Prior Function Level of Independence: Needs assistance   Gait / Transfers Assistance Needed: uses walker for ambulation.           Hand Dominance        Extremity/Trunk Assessment   Upper Extremity Assessment: LUE deficits/detail       LUE Deficits / Details: in sling   Lower Extremity Assessment: Generalized weakness      Cervical / Trunk Assessment: Normal  Communication   Communication: No difficulties  Cognition Arousal/Alertness: Awake/alert Behavior During Therapy: WFL for tasks assessed/performed Overall Cognitive Status: History of cognitive impairments - at baseline                      General Comments      Exercises        Assessment/Plan  PT Assessment Patient needs continued PT services  PT Diagnosis Difficulty walking;Generalized weakness;Acute pain   PT Problem List Decreased strength;Decreased activity tolerance;Decreased balance;Decreased mobility;Decreased knowledge of use of DME;Decreased cognition;Pain  PT Treatment Interventions DME instruction;Gait training;Functional mobility training;Therapeutic activities;Patient/family education;Balance training;Therapeutic exercise   PT Goals (Current goals can be found in the Care Plan section) Acute Rehab PT Goals Patient Stated Goal: none stated PT Goal Formulation: With patient Time For Goal Achievement: 02/16/16 Potential to Achieve Goals: Good    Frequency Min 3X/week   Barriers to  discharge        Co-evaluation               End of Session Equipment Utilized During Treatment: Gait belt Activity Tolerance: Patient tolerated treatment well Patient left: in bed;with call bell/phone within reach;with bed alarm set      Functional Assessment Tool Used: clinical judgement Functional Limitation: Mobility: Walking and moving around Mobility: Walking and Moving Around Current Status [id number]): At least 20 percent but less than 40 percent impaired, limited or restricted Mobility: Walking and Moving Around Goal Status [phone]): At least 1 percent but less than 20 percent impaired, limited or restricted    Time: 1334-1350 PT Time Calculation (min) (ACUTE ONLY): 16 min   Charges:   PT Evaluation $PT Eval Low Complexity: 1 Procedure     PT G Codes:   PT G-Codes **NOT FOR INPATIENT CLASS** Functional Assessment Tool Used: clinical judgement Functional Limitation: Mobility: Walking and moving around Mobility: Walking and Moving Around Current Status [id number]): At least 20 percent but less than 40 percent impaired, limited or restricted Mobility: Walking and Moving Around Goal Status [phone]): At least 1 percent but less than 20 percent impaired, limited or restricted    Weston Anna, MPT Pager: [phone]

## 2016-02-03 DIAGNOSIS — I951 Orthostatic hypotension: Secondary | ICD-10-CM | POA: Diagnosis not present

## 2016-02-03 DIAGNOSIS — S62102A Fracture of unspecified carpal bone, left wrist, initial encounter for closed fracture: Secondary | ICD-10-CM | POA: Diagnosis not present

## 2016-02-03 DIAGNOSIS — N3000 Acute cystitis without hematuria: Secondary | ICD-10-CM | POA: Diagnosis not present

## 2016-02-03 DIAGNOSIS — I10 Essential (primary) hypertension: Secondary | ICD-10-CM | POA: Diagnosis not present

## 2016-02-03 LAB — URINE CULTURE

## 2016-02-03 LAB — GLUCOSE, CAPILLARY
GLUCOSE-CAPILLARY: 105 mg/dL — AB (ref 65–99)
Glucose-Capillary: 111 mg/dL — ABNORMAL HIGH (ref 65–99)

## 2016-02-03 MED ORDER — HYDROCODONE-ACETAMINOPHEN 5-325 MG PO TABS: 1.0000 | ORAL_TABLET | Freq: Four times a day (QID) | ORAL | Status: AC | PRN

## 2016-02-03 NOTE — Clinical Social Work Note (Signed)
Clinical Social Work Assessment  Patient Details  Name: Sheryl Lang MRN: [id number] Date of Birth: August 09, 1921  Date of referral:  02/02/16               Reason for consult:  Facility Placement                Permission sought to share information with:  Case Manager Permission granted to share information::  No  Name::        Agency::     Relationship::     Contact Information:     Housing/Transportation Living arrangements for the past 2 months:  [place] of Information:  Patient, Adult Children Patient Interpreter Needed:  None Criminal Activity/Legal Involvement Pertinent to Current Situation/Hospitalization:    Significant Relationships:  Adult Children Lives with:  Facility Resident Do you feel safe going back to the place where you live?  Yes Need for family participation in patient care:  Yes (Comment)  Care giving concerns:  Patient was confused about things at times but family was able to explain care giving concerns.  Patient has been living at [place] at [facility] facility. Patient daughter reported she will return to [place] and receive SNF services.   Social Worker assessment / plan:  LCSWA met with family at bedside and explained role and reason for consult. Patient family informed LCSWA that patient was returning to facility with SNF services.   Plan: Support Family with disposition back to facility where patient will receive SNF services.   Employment status:  Retired Forensic scientist:  [employer] PT Recommendations:  [place] / Referral to community resources:  [place]  Patient/Family's Response to care:  Agreeable Patient/Family's Understanding of and Emotional Response to Diagnosis, Current Treatment, and Prognosis:  Patient family reports they will like patient to return to facility where she has been receiving care. Patient daughter explained she had spoken with  Social worker at facility about patient return, and ability to receive SNF services upon admission.   Emotional Assessment Appearance:  Appears stated age Attitude/Demeanor/Rapport:   (Coopertive) Affect (typically observed):  Pleasant, Accepting Orientation:  Oriented to Self, Oriented to Situation, Oriented to Place Alcohol / Substance use:  Not Applicable Psych involvement (Current and /or in the community):  No (Comment)  Discharge Needs  Concerns to be addressed:  No discharge needs identified Readmission within the last 30 days:  No Current discharge risk:  None Barriers to Discharge:  No Barriers Identified   Lia Hopping, LCSW 02/03/2016, 1:44 PM

## 2016-02-03 NOTE — Progress Notes (Signed)
Physical Therapy Treatment Patient Details Name: MYLEI MEIKLE MRN: [id number] DOB: [birth year] Today's Date: 02/03/2016    History of Present Illness 82 yo female admitted with syncope, fall, L distal radial fx. Hx of HTN, dementia, osteoporosis.     PT Comments    Progressing slowly with mobility. Continue to recommend ST rehab at SNF prior to returning to Ind Living/ALF.   Follow Up Recommendations  SNF     Equipment Recommendations  None recommended by PT    Recommendations for Other Services       Precautions / Restrictions Precautions Precautions: Fall Required Braces or Orthoses: Sling (L UE) Restrictions Weight Bearing Restrictions: Yes LUE Weight Bearing: Non weight bearing    Mobility  Bed Mobility Overal bed mobility: Needs Assistance Bed Mobility: Supine to Sit;Sit to Supine     Supine to sit: Min assist;HOB elevated Sit to supine: Min assist;HOB elevated   General bed mobility comments: Increased time. HOB elevated  Transfers Overall transfer level: Needs assistance   Transfers: Sit to/from Stand;Stand Pivot Transfers Sit to Stand: Mod assist Stand pivot transfers: Mod assist       General transfer comment: Mod assist to stand without R UE support. Min assist when pt was able to use arm of BSC for support. Stand pivot x 2, bed<>bsc. Again, Min assist to pivot with R UE support on BSC arm, Mod assist going towards weak side.  Pt continues to have  posterior bias and narrow BOS  Ambulation/Gait             General Gait Details: NT-pt unable at this time   Stairs            Wheelchair Mobility    Modified Rankin (Stroke Patients Only)       Balance           Standing balance support: During functional activity Standing balance-Leahy Scale: Poor                      Cognition Arousal/Alertness: Awake/alert Behavior During Therapy: WFL for tasks assessed/performed Overall Cognitive Status: History of cognitive  impairments - at baseline                      Exercises      General Comments        Pertinent Vitals/Pain Pain Assessment: Faces Faces Pain Scale: Hurts a little bit Pain Location: L arm/wrist Pain Descriptors / Indicators: Sore Pain Intervention(s): Monitored during session;Repositioned    Home Living                      Prior Function            PT Goals (current goals can now be found in the care plan section) Progress towards PT goals: Progressing toward goals    Frequency  Min 3X/week    PT Plan Current plan remains appropriate    Co-evaluation             End of Session Equipment Utilized During Treatment: Gait belt Activity Tolerance: Patient tolerated treatment well Patient left: in bed;with call bell/phone within reach;with bed alarm set;with family/visitor present     Time: [id number] PT Time Calculation (min) (ACUTE ONLY): 16 min  Charges:  $Therapeutic Activity: 8-22 mins                    G Codes:  Functional Assessment Tool Used: clinical judgement Mobility:  Walking and Moving Around Goal Status [phone]): At least 1 percent but less than 20 percent impaired, limited or restricted Mobility: Walking and Moving Around Discharge Status [phone]): At least 20 percent but less than 40 percent impaired, limited or restricted   Weston Anna, MPT Pager: [phone]

## 2016-02-03 NOTE — Care Management Obs Status (Signed)
[place] NOTIFICATION   Patient Details  Name: Sheryl Lang MRN: [id number] Date of Birth: 1921/04/17   Medicare Observation Status Notification Given:  Yes    Leeroy Cha, RN 02/03/2016, 9:35 AM

## 2016-02-03 NOTE — Progress Notes (Signed)
Disposition: LCSWA met with patient and daughter about disposition to [place]. Daughter reported she felt comfortable transporting her mother. LCSWA informed facility and Nurse. LCSWA completed packet for patient with signed scripts.   Kathrin Greathouse, Latanya Presser, MSW Clinical Social Worker 5E and Psychiatric Service Line [phone] 02/03/2016  2:00 PM

## 2016-02-03 NOTE — NC FL2 (Signed)
[place] LEVEL OF CARE SCREENING TOOL     IDENTIFICATION  Patient Name: Sheryl Lang Birthdate: 17-Nov-1920 Sex: female Admission Date (Current Location): 02/01/2016  [facility] and [place] Number:  Herbalist and Address:  [facility],  [place] [address], [place]      Provider Number: [id number]  Attending Physician Name and Address:  Jonetta Osgood, MD  Relative Name and Phone Number:       Current Level of Care: SNF Recommended Level of Care: [place] Prior Approval Number:    Date Approved/Denied:   PASRR Number:  [id number]   Discharge Plan: SNF    Current Diagnoses: Patient Active Problem List   Diagnosis Date Noted  . Distal radius fracture   . Left wrist fracture 02/01/2016  . Normocytic anemia 02/01/2016  . Hypomagnesemia 02/01/2016  . Orthostatic hypotension 02/01/2016  . Syncope due to orthostatic hypotension 02/01/2016  . UTI (urinary tract infection) 02/01/2016  . Benign skin cyst 11/08/2015  . Hemorrhoids 11/15/2014  . Medicare annual wellness visit, subsequent 04/05/2014  . Unsteady gait 04/05/2014  . Dermatitis, seborrheic 05/19/2013  . Urinary tract infection, site not specified 05/19/2013  . Right knee pain 10/20/2012  . Blepharitis of left upper eyelid 06/15/2012  . Chronic diarrhea 03/06/2012  . Depression 03/06/2012  . Back pain 05/09/2011  . Hypercalcemia 05/09/2011  . Memory loss 04/21/2011  . Essential hypertension 09/16/2010  . COLONIC POLYPS, BENIGN 09/16/2010  . VARICOSE VEINS LOWER EXTREMITIES W/OTH COMPS 09/16/2010  . Bilateral inguinal hernia 09/16/2010  . OTHER OSTEOPOROSIS 09/16/2010    Orientation RESPIRATION BLADDER Height & Weight     Self, Situation, Place  Normal Continent Weight: 133 lb 6.1 oz (60.5 kg) Height:  5\' 2"  (157.5 cm)  BEHAVIORAL SYMPTOMS/MOOD NEUROLOGICAL BOWEL NUTRITION STATUS      Continent Diet (Normal)  AMBULATORY STATUS COMMUNICATION  OF NEEDS Skin   Limited Assist Verbally Normal                       Personal Care Assistance Level of Assistance  Bathing, Feeding, Dressing Bathing Assistance: Limited assistance Feeding assistance: Limited assistance Dressing Assistance: Limited assistance     Functional Limitations Info  Sight, Hearing, Speech Sight Info: Adequate Hearing Info: Adequate Speech Info: Adequate    SPECIAL CARE FACTORS FREQUENCY  PT (By licensed PT)     PT Frequency: 5              Contractures Contractures Info: Not present    Additional Factors Info  Code Status, Allergies, Psychotropic, Insulin Sliding Scale, Isolation Precautions Code Status Info: Fullcode Allergies Info: Aricept, Penicillins   Insulin Sliding Scale Info: None Isolation Precautions Info: None     Current Medications (02/03/2016):  This is the current hospital active medication list Current Facility-Administered Medications  Medication Dose Route Frequency Provider Last Rate Last Dose  . acetaminophen (TYLENOL) tablet 650 mg  650 mg Oral Q6H PRN Vianne Bulls, MD       Or  . acetaminophen (TYLENOL) suppository 650 mg  650 mg Rectal Q6H PRN Vianne Bulls, MD      . ciprofloxacin (CIPRO) tablet 500 mg  500 mg Oral BID Thurnell Lose, MD   500 mg at 02/03/16 0927  . citalopram (CELEXA) tablet 20 mg  20 mg Oral Daily Vianne Bulls, MD   20 mg at 02/03/16 0927  . enoxaparin (LOVENOX) injection 40 mg  40  mg Subcutaneous Q24H Ilene Qua Opyd, MD   40 mg at 02/02/16 2116  . hydrALAZINE (APRESOLINE) injection 10 mg  10 mg Intravenous Q4H PRN Vianne Bulls, MD      . HYDROcodone-acetaminophen (NORCO/VICODIN) 5-325 MG per tablet 1-2 tablet  1-2 tablet Oral Q4H PRN Vianne Bulls, MD   1 tablet at 02/02/16 2313  . hydrocortisone (ANUSOL-HC) suppository 25 mg  25 mg Rectal QHS PRN Ilene Qua Opyd, MD      . ondansetron (ZOFRAN) tablet 4 mg  4 mg Oral Q6H PRN Vianne Bulls, MD       Or  . ondansetron (ZOFRAN)  injection 4 mg  4 mg Intravenous Q6H PRN Vianne Bulls, MD      . saccharomyces boulardii (FLORASTOR) capsule 250 mg  250 mg Oral BID Thurnell Lose, MD   250 mg at 02/03/16 [id number]  . sodium chloride flush (NS) 0.9 % injection 3 mL  3 mL Intravenous Q12H Vianne Bulls, MD   3 mL at 02/03/16 [id number]     Discharge Medications: Please see discharge summary for a list of discharge medications.  Relevant Imaging Results:  Relevant Lab Results:   Additional Information [id number]  Lia Hopping, LCSW

## 2016-02-03 NOTE — Progress Notes (Signed)
Pt discharged from the unit via wheelchair. Discharge packet was given to the daughter that was transporting the pt back to [facility]. Pt belongings sent home with the family. No questions or concerns from the pt or family at this time.  Meigan Pates W Stpehanie Montroy, RN

## 2016-02-03 NOTE — Discharge Summary (Signed)
PATIENT DETAILS Name: Sheryl Lang Age: 82 y.o. Sex: female Date of Birth: 03/24/1921 MRN: [id number]. Admitting Physician: Vianne Bulls, MD [id number], ADAM, MD  Admit Date: 02/01/2016 Discharge date: 02/03/2016  Recommendations for Outpatient Follow-up:  1. Ensure follow up with Dr Percell Miller (ortho) in 10 days with repeat Xray of the left wrist.  2. Atenolol discontinued due to orthostatic hypotension 3. Please repeat CBC/BMET at next visit  PRIMARY DISCHARGE DIAGNOSIS:  Principal Problem:   Syncope due to orthostatic hypotension Active Problems:   Essential hypertension   Left wrist fracture   Normocytic anemia   Hypomagnesemia   Orthostatic hypotension   UTI (urinary tract infection)   Distal radius fracture      PAST MEDICAL HISTORY: Past Medical History  Diagnosis Date  . Osteoporosis   . Hypertension   . Transient memory loss 04/07/2011  . Right knee pain 10/20/2012  . Dermatitis, seborrheic 05/19/2013  . UTI (urinary tract infection) 05/19/2013  . Urinary tract infection, site not specified 05/19/2013  . Medicare annual wellness visit, subsequent 04/05/2014  . Hemorrhoids 11/15/2014  . Benign skin cyst 11/08/2015    DISCHARGE MEDICATIONS: Current Discharge Medication List    START taking these medications   Details  HYDROcodone-acetaminophen (NORCO/VICODIN) 5-325 MG tablet Take 1 tablet by mouth every 6 (six) hours as needed for moderate pain. Qty: 30 tablet, Refills: 0      CONTINUE these medications which have NOT CHANGED   Details  citalopram (CELEXA) 20 MG tablet Take 1 tablet (20 mg total) by mouth daily. Qty: 90 tablet, Refills: 2    psyllium (METAMUCIL) 58.6 % packet Take 1 packet by mouth daily.    VITAMIN D, CHOLECALCIFEROL, PO Take 1 tablet by mouth daily.      zinc gluconate 50 MG tablet Take 50 mg by mouth daily.    diclofenac sodium (VOLTAREN) 1 % GEL Apply 1 application topically 2 (two) times daily as needed. Refills: 0      hydrocortisone (ANUSOL-HC) 25 MG suppository Place 1 suppository (25 mg total) rectally at bedtime as needed for hemorrhoids or itching. Qty: 12 suppository, Refills: 2    Probiotic Product (PROBIOTIC DAILY) CAPS Take 1 capsule by mouth daily.     simethicone (MYLICON) 0000000 MG chewable tablet Chew 125 mg by mouth every 6 (six) hours as needed for flatulence.      STOP taking these medications     acetaminophen (TYLENOL) 500 MG tablet      atenolol (TENORMIN) 50 MG tablet      ibuprofen (ADVIL,MOTRIN) 200 MG tablet         ALLERGIES:   Allergies  Allergen Reactions  . Aricept [Donepezil Hcl] Other (See Comments)    Increased confusion  . Penicillins     BRIEF HPI:  See H&P, Labs, Consult and Test reports for all details in brief, Patient is a 82 y.o. female past medical history of hypertension, dementia who sustained a left wrist fracture following a syncopal episode  CONSULTATIONS:   None  PERTINENT RADIOLOGIC STUDIES: Dg Chest 2 View  02/01/2016  CLINICAL DATA:  Unwitnessed fall, swelling in LEFT hand EXAM: CHEST  2 VIEW COMPARISON:  None. FINDINGS: Normal cardiac silhouette. Ectatic aorta. Central venous pulmonary congestion present. No effusion, infiltrate pneumothorax. IMPRESSION: Central venous congestion and ectatic aorta.  No acute findings. Electronically Signed   By: Suzy Bouchard M.D.   On: 02/01/2016 15:14   Dg Wrist Complete Left  02/01/2016  CLINICAL DATA:  Unwitnessed fall  yesterday, LEFT wrist pain, LEFT hand swelling, initial encounter EXAM: LEFT WRIST - COMPLETE 3+ VIEW COMPARISON:  None FINDINGS: Marked osseous demineralization. Joint space narrowing at first [facility] joint and STT joint. Subchondral cystic change at ulnar margin of distal radius adjacent to distal radioulnar joint. Extensive chondrocalcinosis question CPPD. Nondisplaced distal radial metaphyseal fracture without articular extension. No additional fracture, dislocation, or bone destruction.  Significant soft tissue swelling at wrist. IMPRESSION: Nondisplaced distal LEFT radial metaphyseal fracture. Osseous demineralization with scattered degenerative changes and suspected CPPD. Electronically Signed   By: Lavonia Dana M.D.   On: 02/01/2016 15:13   Ct Head Wo Contrast  02/01/2016  CLINICAL DATA:  Status post fall yesterday. Patient had a seizure at the doctor's office today. EXAM: CT HEAD WITHOUT CONTRAST CT CERVICAL SPINE WITHOUT CONTRAST TECHNIQUE: Multidetector CT imaging of the head and cervical spine was performed following the standard protocol without intravenous contrast. Multiplanar CT image reconstructions of the cervical spine were also generated. COMPARISON:  None. FINDINGS: CT HEAD FINDINGS There is chronic diffuse atrophy. Chronic bilateral periventricular white matter small vessel ischemic changes are noted. There is no midline shift, hydrocephalus, or mass. No acute hemorrhage or acute transcortical infarct is identified. The bony calvarium is intact. Minimal mucoperiosteal thickening of bilateral ethmoid and maxillary sinuses are identified. CT CERVICAL SPINE FINDINGS There is no acute fracture or dislocation. Degenerative joint changes of the spine are identified. The prevertebral soft tissues are normal. There is no malalignment. There is scarring of the lung apices. IMPRESSION: No focal acute intracranial abnormality identified. No acute fracture or dislocation of cervical spine. Chronic diffuse atrophy and chronic bilateral periventricular white matter small vessel ischemic change. Electronically Signed   By: Abelardo Diesel M.D.   On: 02/01/2016 15:04   Ct Cervical Spine Wo Contrast  02/01/2016  CLINICAL DATA:  Status post fall yesterday. Patient had a seizure at the doctor's office today. EXAM: CT HEAD WITHOUT CONTRAST CT CERVICAL SPINE WITHOUT CONTRAST TECHNIQUE: Multidetector CT imaging of the head and cervical spine was performed following the standard protocol without  intravenous contrast. Multiplanar CT image reconstructions of the cervical spine were also generated. COMPARISON:  None. FINDINGS: CT HEAD FINDINGS There is chronic diffuse atrophy. Chronic bilateral periventricular white matter small vessel ischemic changes are noted. There is no midline shift, hydrocephalus, or mass. No acute hemorrhage or acute transcortical infarct is identified. The bony calvarium is intact. Minimal mucoperiosteal thickening of bilateral ethmoid and maxillary sinuses are identified. CT CERVICAL SPINE FINDINGS There is no acute fracture or dislocation. Degenerative joint changes of the spine are identified. The prevertebral soft tissues are normal. There is no malalignment. There is scarring of the lung apices. IMPRESSION: No focal acute intracranial abnormality identified. No acute fracture or dislocation of cervical spine. Chronic diffuse atrophy and chronic bilateral periventricular white matter small vessel ischemic change. Electronically Signed   By: Abelardo Diesel M.D.   On: 02/01/2016 15:04     PERTINENT LAB RESULTS: CBC:  Recent Labs  02/01/16 1410  WBC 7.4  HGB 11.6*  HCT 35.5*  PLT 226   CMET CMP     Component Value Date/Time   NA 138 02/02/2016 0541   K 3.4* 02/02/2016 0541   CL 107 02/02/2016 0541   CO2 23 02/02/2016 0541   GLUCOSE 104* 02/02/2016 0541   BUN 22* 02/02/2016 0541   CREATININE 0.68 02/02/2016 0541   CREATININE 0.75 04/02/2014 1159   CALCIUM 8.4* 02/02/2016 0541   CALCIUM 10.5 10/17/2011 1119  PROT 6.8 02/01/2016 1410   ALBUMIN 3.7 02/01/2016 1410   AST 22 02/01/2016 1410   ALT 12* 02/01/2016 1410   ALKPHOS 74 02/01/2016 1410   BILITOT 0.8 02/01/2016 1410   GFRNONAA >60 02/02/2016 0541   GFRAA >60 02/02/2016 0541    GFR Estimated Creatinine Clearance: 37.7 mL/min (by C-G formula based on Cr of 0.68). No results for input(s): LIPASE, AMYLASE in the last 72 hours.  Recent Labs  02/01/16 1958  TROPONINI <0.03   Invalid  input(s): POCBNP No results for input(s): DDIMER in the last 72 hours. No results for input(s): HGBA1C in the last 72 hours. No results for input(s): CHOL, HDL, LDLCALC, TRIG, CHOLHDL, LDLDIRECT in the last 72 hours. No results for input(s): TSH, T4TOTAL, T3FREE, THYROIDAB in the last 72 hours.  Invalid input(s): FREET3 No results for input(s): VITAMINB12, FOLATE, FERRITIN, TIBC, IRON, RETICCTPCT in the last 72 hours. Coags: No results for input(s): INR in the last 72 hours.  Invalid input(s): PT Microbiology: Recent Results (from the past 240 hour(s))  Urine culture     Status: Abnormal   Collection Time: 02/01/16  3:40 PM  Result Value Ref Range Status   Specimen Description URINE, CLEAN CATCH  Final   Special Requests NONE  Final   Culture MULTIPLE SPECIES PRESENT, SUGGEST RECOLLECTION (A)  Final   Report Status 02/03/2016 FINAL  Final  MRSA PCR Screening     Status: None   Collection Time: 02/01/16  8:16 PM  Result Value Ref Range Status   MRSA by PCR NEGATIVE NEGATIVE Final    Comment:        The GeneXpert MRSA Assay (FDA approved for NASAL specimens only), is one component of a comprehensive MRSA colonization surveillance program. It is not intended to diagnose MRSA infection nor to guide or monitor treatment for MRSA infections.      BRIEF HOSPITAL COURSE:  Syncope: Suspect secondary to orthostatic hypotension. Telemetry negative,Echocardiogram without significant changes. Hold beta blocker for now. Follow with PCP.  Active Problems: Orthostatic hypotension: No longer orthostatic.Continue to hold beta blocker, allow mild permissive hypertension thigh-high TED hose.Follow with PCP  UTI: Very hard to discern whether she is symptomatic or not-given dementia.But would plan on at least 3 days of antibiotics-which she has completed. Urine cultures shows multiple bacterial morphotypes.She does not need any further antibiotics on discharge. No point in repeating urine  cultures-patient is afebrile and has already completed 3 days of treatment.  Left wrist fracture: Secondary to syncopal episode. X-ray findings discussed with Dr. Percell Miller over the phone-and he subsequently reviewed the images and has advised a splint, and outpatient follow-up with him.  Hypokalemia: Repleted  Dementia: Mildly confused, supportive care only.Seems to be close to her baseline.  TODAY-DAY OF DISCHARGE:  Subjective:   Sheryl Lang today has no headache,no chest abdominal pain,no new weakness tingling or numbness, feels much better wants to go home today.   Objective:   Blood pressure 149/87, pulse 62, temperature 98.3 F (36.8 C), temperature source Oral, resp. rate 19, height 5\' 2"  (1.575 m), weight 60.5 kg (133 lb 6.1 oz), SpO2 96 %.  Intake/Output Summary (Last 24 hours) at 02/03/16 1016 Last data filed at 02/03/16 0933  Gross per 24 hour  Intake    363 ml  Output      0 ml  Net    363 ml   Filed Weights   02/01/16 1904 02/02/16 0309 02/03/16 0505  Weight: 63.1 kg (139 lb 1.8 oz) 61.1  kg (134 lb 11.2 oz) 60.5 kg (133 lb 6.1 oz)    Exam Awake Alert, Oriented *3, No new F.N deficits, Normal affect [place].AT,PERRAL Supple Neck,No JVD, No cervical lymphadenopathy appriciated.  Symmetrical Chest wall movement, Good air movement bilaterally, CTAB RRR,No Gallops,Rubs or new Murmurs, No Parasternal Heave +ve B.Sounds, Abd Soft, Non tender, No organomegaly appriciated, No rebound -guarding or rigidity. No Cyanosis, Clubbing or edema, No new Rash or bruise  DISCHARGE CONDITION: Stable  DISPOSITION: SNF  DISCHARGE INSTRUCTIONS:    Activity:  As tolerated with Full fall precautions use walker/cane & assistance as needed  Get Medicines reviewed and adjusted: Please take all your medications with you for your next visit with your Primary MD  Please request your Primary MD to go over all hospital tests and procedure/radiological results at the follow up, please ask  your Primary MD to get all Hospital records sent to his/her office.  If you experience worsening of your admission symptoms, develop shortness of breath, life threatening emergency, suicidal or homicidal thoughts you must seek medical attention immediately by calling 911 or calling your MD immediately  if symptoms less severe.  You must read complete instructions/literature along with all the possible adverse reactions/side effects for all the Medicines you take and that have been prescribed to you. Take any new Medicines after you have completely understood and accpet all the possible adverse reactions/side effects.   Do not drive when taking Pain medications.   Do not take more than prescribed Pain, Sleep and Anxiety Medications  Special Instructions: If you have smoked or chewed Tobacco  in the last 2 yrs please stop smoking, stop any regular Alcohol  and or any Recreational drug use.  Wear Seat belts while driving.  Please note  You were cared for by a hospitalist during your hospital stay. Once you are discharged, your primary care physician will handle any further medical issues. Please note that NO REFILLS for any discharge medications will be authorized once you are discharged, as it is imperative that you return to your primary care physician (or establish a relationship with a primary care physician if you do not have one) for your aftercare needs so that they can reassess your need for medications and monitor your lab values.   Diet recommendation: Heart Healthy diet  Discharge Instructions    Diet - low sodium heart healthy    Complete by:  As directed      Increase activity slowly    Complete by:  As directed            Follow-up Information    Follow up with [facility], ADAM, MD. Schedule an appointment as soon as possible for a visit in 2 weeks.   Specialty:  Family Medicine   Contact information:   [address] [place] [address] [place] [address] [phone]         Follow up with Renette Butters, MD. Schedule an appointment as soon as possible for a visit in 10 days.   Specialty:  Orthopedic Surgery   Why:  Hospital follow up-and repeat Xray   Contact information:   [address] [place] [address] [phone]      Total Time spent on discharge equals 25  minutes.  SignedOren Binet 02/03/2016 10:16 AM

## 2016-02-04 DIAGNOSIS — F329 Major depressive disorder, single episode, unspecified: Secondary | ICD-10-CM | POA: Diagnosis not present

## 2016-02-04 DIAGNOSIS — M79602 Pain in left arm: Secondary | ICD-10-CM | POA: Diagnosis not present

## 2016-02-04 DIAGNOSIS — R2689 Other abnormalities of gait and mobility: Secondary | ICD-10-CM | POA: Diagnosis not present

## 2016-02-04 DIAGNOSIS — G3184 Mild cognitive impairment, so stated: Secondary | ICD-10-CM | POA: Diagnosis not present

## 2016-02-04 DIAGNOSIS — N390 Urinary tract infection, site not specified: Secondary | ICD-10-CM | POA: Diagnosis not present

## 2016-02-04 DIAGNOSIS — I10 Essential (primary) hypertension: Secondary | ICD-10-CM | POA: Diagnosis not present

## 2016-02-04 DIAGNOSIS — M25561 Pain in right knee: Secondary | ICD-10-CM | POA: Diagnosis not present

## 2016-02-04 DIAGNOSIS — R41841 Cognitive communication deficit: Secondary | ICD-10-CM | POA: Diagnosis not present

## 2016-02-04 DIAGNOSIS — I951 Orthostatic hypotension: Secondary | ICD-10-CM | POA: Diagnosis not present

## 2016-02-04 DIAGNOSIS — D649 Anemia, unspecified: Secondary | ICD-10-CM | POA: Diagnosis not present

## 2016-02-04 DIAGNOSIS — E559 Vitamin D deficiency, unspecified: Secondary | ICD-10-CM | POA: Diagnosis not present

## 2016-02-04 DIAGNOSIS — R278 Other lack of coordination: Secondary | ICD-10-CM | POA: Diagnosis not present

## 2016-02-04 DIAGNOSIS — M6281 Muscle weakness (generalized): Secondary | ICD-10-CM | POA: Diagnosis not present

## 2016-02-06 DIAGNOSIS — R2689 Other abnormalities of gait and mobility: Secondary | ICD-10-CM | POA: Diagnosis not present

## 2016-02-06 DIAGNOSIS — M6281 Muscle weakness (generalized): Secondary | ICD-10-CM | POA: Diagnosis not present

## 2016-02-06 DIAGNOSIS — R278 Other lack of coordination: Secondary | ICD-10-CM | POA: Diagnosis not present

## 2016-02-06 DIAGNOSIS — R41841 Cognitive communication deficit: Secondary | ICD-10-CM | POA: Diagnosis not present

## 2016-02-07 DIAGNOSIS — R278 Other lack of coordination: Secondary | ICD-10-CM | POA: Diagnosis not present

## 2016-02-07 DIAGNOSIS — M6281 Muscle weakness (generalized): Secondary | ICD-10-CM | POA: Diagnosis not present

## 2016-02-07 DIAGNOSIS — R2689 Other abnormalities of gait and mobility: Secondary | ICD-10-CM | POA: Diagnosis not present

## 2016-02-07 DIAGNOSIS — R41841 Cognitive communication deficit: Secondary | ICD-10-CM | POA: Diagnosis not present

## 2016-02-08 ENCOUNTER — Telehealth: Payer: Self-pay | Admitting: Physician Assistant

## 2016-02-08 DIAGNOSIS — M6281 Muscle weakness (generalized): Secondary | ICD-10-CM | POA: Diagnosis not present

## 2016-02-08 DIAGNOSIS — R278 Other lack of coordination: Secondary | ICD-10-CM | POA: Diagnosis not present

## 2016-02-08 DIAGNOSIS — R2689 Other abnormalities of gait and mobility: Secondary | ICD-10-CM | POA: Diagnosis not present

## 2016-02-08 DIAGNOSIS — R41841 Cognitive communication deficit: Secondary | ICD-10-CM | POA: Diagnosis not present

## 2016-02-08 NOTE — Telephone Encounter (Signed)
Caller name: Relationship to patient: Can be reached: [phone]    Reason for call: Need a current Medication list for patient faxed to [phone]

## 2016-02-08 NOTE — Telephone Encounter (Signed)
Faxed medication list as requested. Called the number below left msg. Medication list faxed

## 2016-02-08 NOTE — Telephone Encounter (Signed)
I am not PCP. Seems Dr. Charlett Blake was original PCP. Will forward to her nurse.

## 2016-02-09 DIAGNOSIS — M25539 Pain in unspecified wrist: Secondary | ICD-10-CM | POA: Diagnosis not present

## 2016-02-09 DIAGNOSIS — R41841 Cognitive communication deficit: Secondary | ICD-10-CM | POA: Diagnosis not present

## 2016-02-09 DIAGNOSIS — M6281 Muscle weakness (generalized): Secondary | ICD-10-CM | POA: Diagnosis not present

## 2016-02-09 DIAGNOSIS — R278 Other lack of coordination: Secondary | ICD-10-CM | POA: Diagnosis not present

## 2016-02-09 DIAGNOSIS — R2689 Other abnormalities of gait and mobility: Secondary | ICD-10-CM | POA: Diagnosis not present

## 2016-02-10 DIAGNOSIS — R278 Other lack of coordination: Secondary | ICD-10-CM | POA: Diagnosis not present

## 2016-02-10 DIAGNOSIS — R41841 Cognitive communication deficit: Secondary | ICD-10-CM | POA: Diagnosis not present

## 2016-02-10 DIAGNOSIS — M6281 Muscle weakness (generalized): Secondary | ICD-10-CM | POA: Diagnosis not present

## 2016-02-10 DIAGNOSIS — R2689 Other abnormalities of gait and mobility: Secondary | ICD-10-CM | POA: Diagnosis not present

## 2016-02-11 DIAGNOSIS — R2689 Other abnormalities of gait and mobility: Secondary | ICD-10-CM | POA: Diagnosis not present

## 2016-02-11 DIAGNOSIS — R41841 Cognitive communication deficit: Secondary | ICD-10-CM | POA: Diagnosis not present

## 2016-02-11 DIAGNOSIS — M6281 Muscle weakness (generalized): Secondary | ICD-10-CM | POA: Diagnosis not present

## 2016-02-11 DIAGNOSIS — R278 Other lack of coordination: Secondary | ICD-10-CM | POA: Diagnosis not present

## 2016-02-14 DIAGNOSIS — R41841 Cognitive communication deficit: Secondary | ICD-10-CM | POA: Diagnosis not present

## 2016-02-14 DIAGNOSIS — M6281 Muscle weakness (generalized): Secondary | ICD-10-CM | POA: Diagnosis not present

## 2016-02-14 DIAGNOSIS — S52502A Unspecified fracture of the lower end of left radius, initial encounter for closed fracture: Secondary | ICD-10-CM | POA: Diagnosis not present

## 2016-02-14 DIAGNOSIS — R278 Other lack of coordination: Secondary | ICD-10-CM | POA: Diagnosis not present

## 2016-02-14 DIAGNOSIS — R2689 Other abnormalities of gait and mobility: Secondary | ICD-10-CM | POA: Diagnosis not present

## 2016-02-15 DIAGNOSIS — M6281 Muscle weakness (generalized): Secondary | ICD-10-CM | POA: Diagnosis not present

## 2016-02-15 DIAGNOSIS — R2689 Other abnormalities of gait and mobility: Secondary | ICD-10-CM | POA: Diagnosis not present

## 2016-02-15 DIAGNOSIS — R278 Other lack of coordination: Secondary | ICD-10-CM | POA: Diagnosis not present

## 2016-02-15 DIAGNOSIS — R41841 Cognitive communication deficit: Secondary | ICD-10-CM | POA: Diagnosis not present

## 2016-02-15 DIAGNOSIS — E871 Hypo-osmolality and hyponatremia: Secondary | ICD-10-CM | POA: Diagnosis not present

## 2016-02-16 DIAGNOSIS — R2689 Other abnormalities of gait and mobility: Secondary | ICD-10-CM | POA: Diagnosis not present

## 2016-02-16 DIAGNOSIS — R41841 Cognitive communication deficit: Secondary | ICD-10-CM | POA: Diagnosis not present

## 2016-02-16 DIAGNOSIS — R278 Other lack of coordination: Secondary | ICD-10-CM | POA: Diagnosis not present

## 2016-02-16 DIAGNOSIS — M6281 Muscle weakness (generalized): Secondary | ICD-10-CM | POA: Diagnosis not present

## 2016-02-17 DIAGNOSIS — R41841 Cognitive communication deficit: Secondary | ICD-10-CM | POA: Diagnosis not present

## 2016-02-17 DIAGNOSIS — R278 Other lack of coordination: Secondary | ICD-10-CM | POA: Diagnosis not present

## 2016-02-17 DIAGNOSIS — M6281 Muscle weakness (generalized): Secondary | ICD-10-CM | POA: Diagnosis not present

## 2016-02-17 DIAGNOSIS — R2689 Other abnormalities of gait and mobility: Secondary | ICD-10-CM | POA: Diagnosis not present

## 2016-02-18 DIAGNOSIS — R2689 Other abnormalities of gait and mobility: Secondary | ICD-10-CM | POA: Diagnosis not present

## 2016-02-18 DIAGNOSIS — R41841 Cognitive communication deficit: Secondary | ICD-10-CM | POA: Diagnosis not present

## 2016-02-18 DIAGNOSIS — M6281 Muscle weakness (generalized): Secondary | ICD-10-CM | POA: Diagnosis not present

## 2016-02-18 DIAGNOSIS — R278 Other lack of coordination: Secondary | ICD-10-CM | POA: Diagnosis not present

## 2016-02-21 DIAGNOSIS — R41841 Cognitive communication deficit: Secondary | ICD-10-CM | POA: Diagnosis not present

## 2016-02-21 DIAGNOSIS — R2689 Other abnormalities of gait and mobility: Secondary | ICD-10-CM | POA: Diagnosis not present

## 2016-02-21 DIAGNOSIS — R278 Other lack of coordination: Secondary | ICD-10-CM | POA: Diagnosis not present

## 2016-02-21 DIAGNOSIS — M6281 Muscle weakness (generalized): Secondary | ICD-10-CM | POA: Diagnosis not present

## 2016-02-22 ENCOUNTER — Ambulatory Visit: Payer: PRIVATE HEALTH INSURANCE | Admitting: Family Medicine

## 2016-02-22 DIAGNOSIS — R2689 Other abnormalities of gait and mobility: Secondary | ICD-10-CM | POA: Diagnosis not present

## 2016-02-22 DIAGNOSIS — M6281 Muscle weakness (generalized): Secondary | ICD-10-CM | POA: Diagnosis not present

## 2016-02-22 DIAGNOSIS — R41841 Cognitive communication deficit: Secondary | ICD-10-CM | POA: Diagnosis not present

## 2016-02-22 DIAGNOSIS — R278 Other lack of coordination: Secondary | ICD-10-CM | POA: Diagnosis not present

## 2016-02-23 DIAGNOSIS — M6281 Muscle weakness (generalized): Secondary | ICD-10-CM | POA: Diagnosis not present

## 2016-02-23 DIAGNOSIS — R2689 Other abnormalities of gait and mobility: Secondary | ICD-10-CM | POA: Diagnosis not present

## 2016-02-23 DIAGNOSIS — R41841 Cognitive communication deficit: Secondary | ICD-10-CM | POA: Diagnosis not present

## 2016-02-23 DIAGNOSIS — R278 Other lack of coordination: Secondary | ICD-10-CM | POA: Diagnosis not present

## 2016-02-24 DIAGNOSIS — R2689 Other abnormalities of gait and mobility: Secondary | ICD-10-CM | POA: Diagnosis not present

## 2016-02-24 DIAGNOSIS — R278 Other lack of coordination: Secondary | ICD-10-CM | POA: Diagnosis not present

## 2016-02-24 DIAGNOSIS — M6281 Muscle weakness (generalized): Secondary | ICD-10-CM | POA: Diagnosis not present

## 2016-02-24 DIAGNOSIS — R41841 Cognitive communication deficit: Secondary | ICD-10-CM | POA: Diagnosis not present

## 2016-02-25 DIAGNOSIS — R278 Other lack of coordination: Secondary | ICD-10-CM | POA: Diagnosis not present

## 2016-02-25 DIAGNOSIS — M25561 Pain in right knee: Secondary | ICD-10-CM | POA: Diagnosis not present

## 2016-02-25 DIAGNOSIS — M6281 Muscle weakness (generalized): Secondary | ICD-10-CM | POA: Diagnosis not present

## 2016-02-25 DIAGNOSIS — R41841 Cognitive communication deficit: Secondary | ICD-10-CM | POA: Diagnosis not present

## 2016-02-25 DIAGNOSIS — R2689 Other abnormalities of gait and mobility: Secondary | ICD-10-CM | POA: Diagnosis not present

## 2016-02-25 DIAGNOSIS — M79602 Pain in left arm: Secondary | ICD-10-CM | POA: Diagnosis not present

## 2016-02-28 DIAGNOSIS — Z9181 History of falling: Secondary | ICD-10-CM | POA: Diagnosis not present

## 2016-02-28 DIAGNOSIS — R2681 Unsteadiness on feet: Secondary | ICD-10-CM | POA: Diagnosis not present

## 2016-02-28 DIAGNOSIS — R2689 Other abnormalities of gait and mobility: Secondary | ICD-10-CM | POA: Diagnosis not present

## 2016-02-28 DIAGNOSIS — M6281 Muscle weakness (generalized): Secondary | ICD-10-CM | POA: Diagnosis not present

## 2016-02-28 DIAGNOSIS — R413 Other amnesia: Secondary | ICD-10-CM | POA: Diagnosis not present

## 2016-02-28 DIAGNOSIS — R278 Other lack of coordination: Secondary | ICD-10-CM | POA: Diagnosis not present

## 2016-02-28 DIAGNOSIS — M25561 Pain in right knee: Secondary | ICD-10-CM | POA: Diagnosis not present

## 2016-03-06 DIAGNOSIS — S52502D Unspecified fracture of the lower end of left radius, subsequent encounter for closed fracture with routine healing: Secondary | ICD-10-CM | POA: Diagnosis not present

## 2016-03-07 DIAGNOSIS — R2681 Unsteadiness on feet: Secondary | ICD-10-CM | POA: Diagnosis not present

## 2016-03-07 DIAGNOSIS — M25561 Pain in right knee: Secondary | ICD-10-CM | POA: Diagnosis not present

## 2016-03-07 DIAGNOSIS — R278 Other lack of coordination: Secondary | ICD-10-CM | POA: Diagnosis not present

## 2016-03-07 DIAGNOSIS — R413 Other amnesia: Secondary | ICD-10-CM | POA: Diagnosis not present

## 2016-03-07 DIAGNOSIS — R2689 Other abnormalities of gait and mobility: Secondary | ICD-10-CM | POA: Diagnosis not present

## 2016-03-07 DIAGNOSIS — M6281 Muscle weakness (generalized): Secondary | ICD-10-CM | POA: Diagnosis not present

## 2016-03-08 DIAGNOSIS — M6281 Muscle weakness (generalized): Secondary | ICD-10-CM | POA: Diagnosis not present

## 2016-03-08 DIAGNOSIS — R413 Other amnesia: Secondary | ICD-10-CM | POA: Diagnosis not present

## 2016-03-08 DIAGNOSIS — R2681 Unsteadiness on feet: Secondary | ICD-10-CM | POA: Diagnosis not present

## 2016-03-08 DIAGNOSIS — R278 Other lack of coordination: Secondary | ICD-10-CM | POA: Diagnosis not present

## 2016-03-08 DIAGNOSIS — R2689 Other abnormalities of gait and mobility: Secondary | ICD-10-CM | POA: Diagnosis not present

## 2016-03-08 DIAGNOSIS — M25561 Pain in right knee: Secondary | ICD-10-CM | POA: Diagnosis not present

## 2016-03-10 DIAGNOSIS — R413 Other amnesia: Secondary | ICD-10-CM | POA: Diagnosis not present

## 2016-03-10 DIAGNOSIS — R278 Other lack of coordination: Secondary | ICD-10-CM | POA: Diagnosis not present

## 2016-03-10 DIAGNOSIS — R2681 Unsteadiness on feet: Secondary | ICD-10-CM | POA: Diagnosis not present

## 2016-03-10 DIAGNOSIS — M25561 Pain in right knee: Secondary | ICD-10-CM | POA: Diagnosis not present

## 2016-03-10 DIAGNOSIS — M6281 Muscle weakness (generalized): Secondary | ICD-10-CM | POA: Diagnosis not present

## 2016-03-10 DIAGNOSIS — R2689 Other abnormalities of gait and mobility: Secondary | ICD-10-CM | POA: Diagnosis not present

## 2016-03-14 DIAGNOSIS — M25561 Pain in right knee: Secondary | ICD-10-CM | POA: Diagnosis not present

## 2016-03-14 DIAGNOSIS — R2689 Other abnormalities of gait and mobility: Secondary | ICD-10-CM | POA: Diagnosis not present

## 2016-03-14 DIAGNOSIS — R2681 Unsteadiness on feet: Secondary | ICD-10-CM | POA: Diagnosis not present

## 2016-03-14 DIAGNOSIS — R278 Other lack of coordination: Secondary | ICD-10-CM | POA: Diagnosis not present

## 2016-03-14 DIAGNOSIS — M6281 Muscle weakness (generalized): Secondary | ICD-10-CM | POA: Diagnosis not present

## 2016-03-14 DIAGNOSIS — R413 Other amnesia: Secondary | ICD-10-CM | POA: Diagnosis not present

## 2016-03-15 DIAGNOSIS — M6281 Muscle weakness (generalized): Secondary | ICD-10-CM | POA: Diagnosis not present

## 2016-03-15 DIAGNOSIS — R278 Other lack of coordination: Secondary | ICD-10-CM | POA: Diagnosis not present

## 2016-03-15 DIAGNOSIS — M25561 Pain in right knee: Secondary | ICD-10-CM | POA: Diagnosis not present

## 2016-03-15 DIAGNOSIS — R2681 Unsteadiness on feet: Secondary | ICD-10-CM | POA: Diagnosis not present

## 2016-03-15 DIAGNOSIS — R413 Other amnesia: Secondary | ICD-10-CM | POA: Diagnosis not present

## 2016-03-15 DIAGNOSIS — R2689 Other abnormalities of gait and mobility: Secondary | ICD-10-CM | POA: Diagnosis not present

## 2016-03-17 DIAGNOSIS — R2681 Unsteadiness on feet: Secondary | ICD-10-CM | POA: Diagnosis not present

## 2016-03-17 DIAGNOSIS — M25561 Pain in right knee: Secondary | ICD-10-CM | POA: Diagnosis not present

## 2016-03-17 DIAGNOSIS — R278 Other lack of coordination: Secondary | ICD-10-CM | POA: Diagnosis not present

## 2016-03-17 DIAGNOSIS — R413 Other amnesia: Secondary | ICD-10-CM | POA: Diagnosis not present

## 2016-03-17 DIAGNOSIS — M6281 Muscle weakness (generalized): Secondary | ICD-10-CM | POA: Diagnosis not present

## 2016-03-17 DIAGNOSIS — R2689 Other abnormalities of gait and mobility: Secondary | ICD-10-CM | POA: Diagnosis not present

## 2016-03-21 DIAGNOSIS — M25561 Pain in right knee: Secondary | ICD-10-CM | POA: Diagnosis not present

## 2016-03-21 DIAGNOSIS — R2681 Unsteadiness on feet: Secondary | ICD-10-CM | POA: Diagnosis not present

## 2016-03-21 DIAGNOSIS — R278 Other lack of coordination: Secondary | ICD-10-CM | POA: Diagnosis not present

## 2016-03-21 DIAGNOSIS — M6281 Muscle weakness (generalized): Secondary | ICD-10-CM | POA: Diagnosis not present

## 2016-03-21 DIAGNOSIS — R2689 Other abnormalities of gait and mobility: Secondary | ICD-10-CM | POA: Diagnosis not present

## 2016-03-22 DIAGNOSIS — R2681 Unsteadiness on feet: Secondary | ICD-10-CM | POA: Diagnosis not present

## 2016-03-22 DIAGNOSIS — R2689 Other abnormalities of gait and mobility: Secondary | ICD-10-CM | POA: Diagnosis not present

## 2016-03-22 DIAGNOSIS — M6281 Muscle weakness (generalized): Secondary | ICD-10-CM | POA: Diagnosis not present

## 2016-03-22 DIAGNOSIS — M25561 Pain in right knee: Secondary | ICD-10-CM | POA: Diagnosis not present

## 2016-03-22 DIAGNOSIS — R278 Other lack of coordination: Secondary | ICD-10-CM | POA: Diagnosis not present

## 2016-03-24 DIAGNOSIS — M6281 Muscle weakness (generalized): Secondary | ICD-10-CM | POA: Diagnosis not present

## 2016-03-24 DIAGNOSIS — R2689 Other abnormalities of gait and mobility: Secondary | ICD-10-CM | POA: Diagnosis not present

## 2016-03-24 DIAGNOSIS — R278 Other lack of coordination: Secondary | ICD-10-CM | POA: Diagnosis not present

## 2016-03-24 DIAGNOSIS — R2681 Unsteadiness on feet: Secondary | ICD-10-CM | POA: Diagnosis not present

## 2016-03-24 DIAGNOSIS — M25561 Pain in right knee: Secondary | ICD-10-CM | POA: Diagnosis not present

## 2016-03-28 DIAGNOSIS — R2689 Other abnormalities of gait and mobility: Secondary | ICD-10-CM | POA: Diagnosis not present

## 2016-03-28 DIAGNOSIS — M6281 Muscle weakness (generalized): Secondary | ICD-10-CM | POA: Diagnosis not present

## 2016-03-28 DIAGNOSIS — R278 Other lack of coordination: Secondary | ICD-10-CM | POA: Diagnosis not present

## 2016-03-29 DIAGNOSIS — R2689 Other abnormalities of gait and mobility: Secondary | ICD-10-CM | POA: Diagnosis not present

## 2016-03-29 DIAGNOSIS — R278 Other lack of coordination: Secondary | ICD-10-CM | POA: Diagnosis not present

## 2016-03-29 DIAGNOSIS — M6281 Muscle weakness (generalized): Secondary | ICD-10-CM | POA: Diagnosis not present

## 2016-03-30 DIAGNOSIS — M6281 Muscle weakness (generalized): Secondary | ICD-10-CM | POA: Diagnosis not present

## 2016-03-30 DIAGNOSIS — R2689 Other abnormalities of gait and mobility: Secondary | ICD-10-CM | POA: Diagnosis not present

## 2016-03-30 DIAGNOSIS — R278 Other lack of coordination: Secondary | ICD-10-CM | POA: Diagnosis not present

## 2016-03-31 DIAGNOSIS — R278 Other lack of coordination: Secondary | ICD-10-CM | POA: Diagnosis not present

## 2016-03-31 DIAGNOSIS — M6281 Muscle weakness (generalized): Secondary | ICD-10-CM | POA: Diagnosis not present

## 2016-03-31 DIAGNOSIS — R2689 Other abnormalities of gait and mobility: Secondary | ICD-10-CM | POA: Diagnosis not present

## 2016-04-03 DIAGNOSIS — R278 Other lack of coordination: Secondary | ICD-10-CM | POA: Diagnosis not present

## 2016-04-03 DIAGNOSIS — R2689 Other abnormalities of gait and mobility: Secondary | ICD-10-CM | POA: Diagnosis not present

## 2016-04-03 DIAGNOSIS — M6281 Muscle weakness (generalized): Secondary | ICD-10-CM | POA: Diagnosis not present

## 2016-04-04 DIAGNOSIS — R278 Other lack of coordination: Secondary | ICD-10-CM | POA: Diagnosis not present

## 2016-04-04 DIAGNOSIS — M6281 Muscle weakness (generalized): Secondary | ICD-10-CM | POA: Diagnosis not present

## 2016-04-04 DIAGNOSIS — R2689 Other abnormalities of gait and mobility: Secondary | ICD-10-CM | POA: Diagnosis not present

## 2016-04-05 DIAGNOSIS — M6281 Muscle weakness (generalized): Secondary | ICD-10-CM | POA: Diagnosis not present

## 2016-04-05 DIAGNOSIS — R278 Other lack of coordination: Secondary | ICD-10-CM | POA: Diagnosis not present

## 2016-04-05 DIAGNOSIS — R2689 Other abnormalities of gait and mobility: Secondary | ICD-10-CM | POA: Diagnosis not present

## 2016-04-07 DIAGNOSIS — R2689 Other abnormalities of gait and mobility: Secondary | ICD-10-CM | POA: Diagnosis not present

## 2016-04-07 DIAGNOSIS — M6281 Muscle weakness (generalized): Secondary | ICD-10-CM | POA: Diagnosis not present

## 2016-04-07 DIAGNOSIS — R278 Other lack of coordination: Secondary | ICD-10-CM | POA: Diagnosis not present

## 2016-04-10 DIAGNOSIS — R2689 Other abnormalities of gait and mobility: Secondary | ICD-10-CM | POA: Diagnosis not present

## 2016-04-10 DIAGNOSIS — M6281 Muscle weakness (generalized): Secondary | ICD-10-CM | POA: Diagnosis not present

## 2016-04-10 DIAGNOSIS — R278 Other lack of coordination: Secondary | ICD-10-CM | POA: Diagnosis not present

## 2016-04-11 DIAGNOSIS — M6281 Muscle weakness (generalized): Secondary | ICD-10-CM | POA: Diagnosis not present

## 2016-04-11 DIAGNOSIS — R2689 Other abnormalities of gait and mobility: Secondary | ICD-10-CM | POA: Diagnosis not present

## 2016-04-11 DIAGNOSIS — R278 Other lack of coordination: Secondary | ICD-10-CM | POA: Diagnosis not present

## 2016-04-12 DIAGNOSIS — R278 Other lack of coordination: Secondary | ICD-10-CM | POA: Diagnosis not present

## 2016-04-12 DIAGNOSIS — R2689 Other abnormalities of gait and mobility: Secondary | ICD-10-CM | POA: Diagnosis not present

## 2016-04-12 DIAGNOSIS — M6281 Muscle weakness (generalized): Secondary | ICD-10-CM | POA: Diagnosis not present

## 2016-04-14 DIAGNOSIS — R2689 Other abnormalities of gait and mobility: Secondary | ICD-10-CM | POA: Diagnosis not present

## 2016-04-14 DIAGNOSIS — R278 Other lack of coordination: Secondary | ICD-10-CM | POA: Diagnosis not present

## 2016-04-14 DIAGNOSIS — M6281 Muscle weakness (generalized): Secondary | ICD-10-CM | POA: Diagnosis not present

## 2016-04-17 DIAGNOSIS — R2689 Other abnormalities of gait and mobility: Secondary | ICD-10-CM | POA: Diagnosis not present

## 2016-04-17 DIAGNOSIS — M6281 Muscle weakness (generalized): Secondary | ICD-10-CM | POA: Diagnosis not present

## 2016-04-17 DIAGNOSIS — R278 Other lack of coordination: Secondary | ICD-10-CM | POA: Diagnosis not present

## 2016-04-18 DIAGNOSIS — M6281 Muscle weakness (generalized): Secondary | ICD-10-CM | POA: Diagnosis not present

## 2016-04-18 DIAGNOSIS — R278 Other lack of coordination: Secondary | ICD-10-CM | POA: Diagnosis not present

## 2016-04-18 DIAGNOSIS — R2689 Other abnormalities of gait and mobility: Secondary | ICD-10-CM | POA: Diagnosis not present

## 2016-04-19 DIAGNOSIS — M6281 Muscle weakness (generalized): Secondary | ICD-10-CM | POA: Diagnosis not present

## 2016-04-19 DIAGNOSIS — R2689 Other abnormalities of gait and mobility: Secondary | ICD-10-CM | POA: Diagnosis not present

## 2016-04-19 DIAGNOSIS — R278 Other lack of coordination: Secondary | ICD-10-CM | POA: Diagnosis not present

## 2016-04-24 DIAGNOSIS — M6281 Muscle weakness (generalized): Secondary | ICD-10-CM | POA: Diagnosis not present

## 2016-04-24 DIAGNOSIS — R2689 Other abnormalities of gait and mobility: Secondary | ICD-10-CM | POA: Diagnosis not present

## 2016-04-24 DIAGNOSIS — R278 Other lack of coordination: Secondary | ICD-10-CM | POA: Diagnosis not present

## 2016-04-26 DIAGNOSIS — R278 Other lack of coordination: Secondary | ICD-10-CM | POA: Diagnosis not present

## 2016-04-26 DIAGNOSIS — M6281 Muscle weakness (generalized): Secondary | ICD-10-CM | POA: Diagnosis not present

## 2016-04-26 DIAGNOSIS — R2689 Other abnormalities of gait and mobility: Secondary | ICD-10-CM | POA: Diagnosis not present

## 2016-04-27 DIAGNOSIS — R278 Other lack of coordination: Secondary | ICD-10-CM | POA: Diagnosis not present

## 2016-04-27 DIAGNOSIS — M6281 Muscle weakness (generalized): Secondary | ICD-10-CM | POA: Diagnosis not present

## 2016-04-27 DIAGNOSIS — R2689 Other abnormalities of gait and mobility: Secondary | ICD-10-CM | POA: Diagnosis not present

## 2016-05-08 DIAGNOSIS — Z9181 History of falling: Secondary | ICD-10-CM | POA: Diagnosis not present

## 2016-05-08 DIAGNOSIS — K5900 Constipation, unspecified: Secondary | ICD-10-CM | POA: Diagnosis not present

## 2016-05-08 DIAGNOSIS — F339 Major depressive disorder, recurrent, unspecified: Secondary | ICD-10-CM | POA: Diagnosis not present

## 2016-05-08 DIAGNOSIS — F0390 Unspecified dementia without behavioral disturbance: Secondary | ICD-10-CM | POA: Diagnosis not present

## 2016-05-12 DIAGNOSIS — D649 Anemia, unspecified: Secondary | ICD-10-CM | POA: Diagnosis not present

## 2016-05-12 DIAGNOSIS — Z79899 Other long term (current) drug therapy: Secondary | ICD-10-CM | POA: Diagnosis not present

## 2016-05-12 DIAGNOSIS — I10 Essential (primary) hypertension: Secondary | ICD-10-CM | POA: Diagnosis not present

## 2016-06-16 DIAGNOSIS — L570 Actinic keratosis: Secondary | ICD-10-CM | POA: Diagnosis not present

## 2016-06-16 DIAGNOSIS — L821 Other seborrheic keratosis: Secondary | ICD-10-CM | POA: Diagnosis not present

## 2016-06-16 DIAGNOSIS — L218 Other seborrheic dermatitis: Secondary | ICD-10-CM | POA: Diagnosis not present

## 2016-07-17 DIAGNOSIS — L299 Pruritus, unspecified: Secondary | ICD-10-CM | POA: Diagnosis not present

## 2016-07-17 DIAGNOSIS — L570 Actinic keratosis: Secondary | ICD-10-CM | POA: Diagnosis not present

## 2016-10-02 DIAGNOSIS — I10 Essential (primary) hypertension: Secondary | ICD-10-CM | POA: Diagnosis not present

## 2016-10-02 DIAGNOSIS — R7989 Other specified abnormal findings of blood chemistry: Secondary | ICD-10-CM | POA: Diagnosis not present

## 2016-10-02 DIAGNOSIS — F339 Major depressive disorder, recurrent, unspecified: Secondary | ICD-10-CM | POA: Diagnosis not present

## 2016-10-02 DIAGNOSIS — R748 Abnormal levels of other serum enzymes: Secondary | ICD-10-CM | POA: Diagnosis not present

## 2016-10-02 DIAGNOSIS — R531 Weakness: Secondary | ICD-10-CM | POA: Diagnosis not present

## 2016-10-02 DIAGNOSIS — I639 Cerebral infarction, unspecified: Secondary | ICD-10-CM | POA: Diagnosis not present

## 2016-10-02 DIAGNOSIS — R509 Fever, unspecified: Secondary | ICD-10-CM | POA: Diagnosis not present

## 2016-10-02 DIAGNOSIS — R4182 Altered mental status, unspecified: Secondary | ICD-10-CM | POA: Diagnosis not present

## 2016-10-03 DIAGNOSIS — R0989 Other specified symptoms and signs involving the circulatory and respiratory systems: Secondary | ICD-10-CM | POA: Diagnosis not present

## 2016-10-26 DEATH — deceased

## 2017-08-11 IMAGING — CT CT HEAD W/O CM
3 of 7 series · 13 of 47 positions shown, 16 images · non-contrast
Comparison: None.

CLINICAL DATA: Status post fall yesterday. Patient had a seizure at
the doctor's office today.

EXAM:
CT HEAD WITHOUT CONTRAST
CT CERVICAL SPINE WITHOUT CONTRAST
TECHNIQUE: Multidetector CT imaging of the head and cervical spine was
performed following the standard protocol without intravenous
contrast. Multiplanar CT image reconstructions of the cervical spine
were also generated.

[Series 9: coronals · coronal · 0.27mm/px · 2 of 61 slices shown]
[im 37/61  brain]
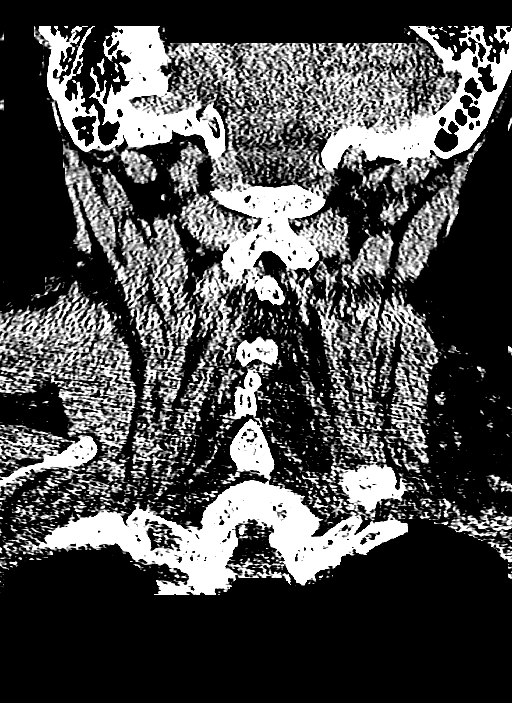
[im 49/61  brain]
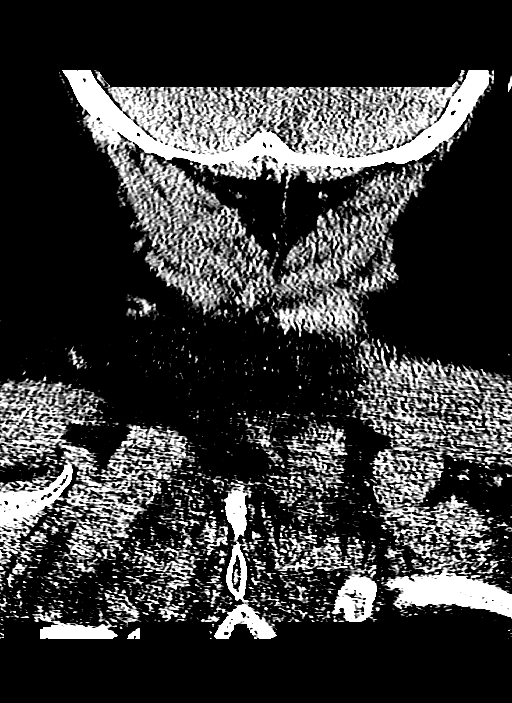

[Series 10: sagittals · sagittal · 0.21mm/px · 2 of 80 slices shown]
[im 27/80  brain]
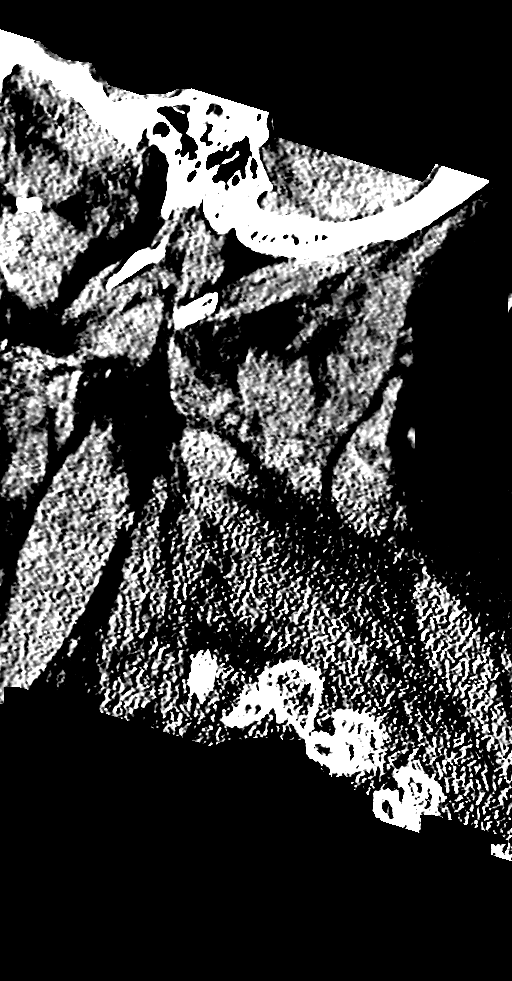
[im 53/80  brain]
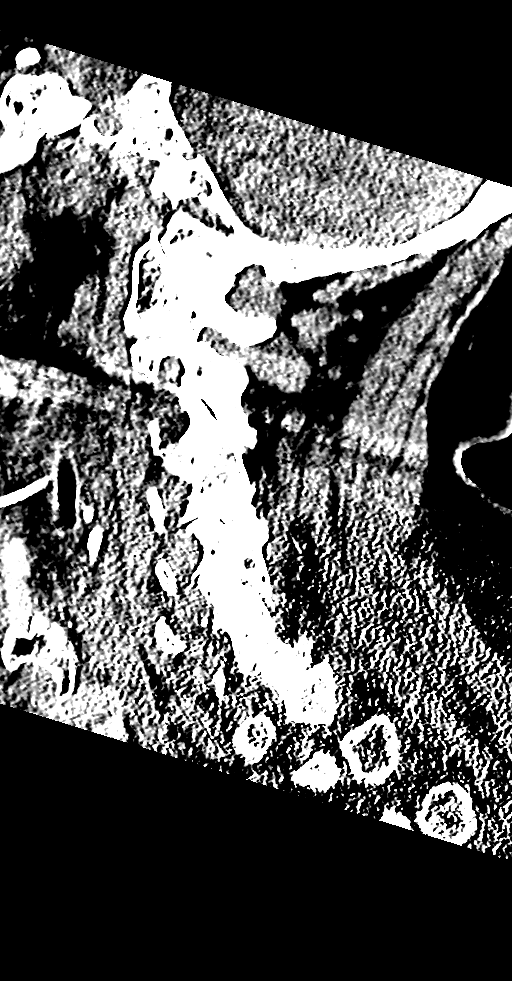

[Series 11: orthogonals · axial · 0.24mm/px · z∈[-381,-262]mm · 9 of 75 slices shown, 12 images]
[im 7/75  brain]
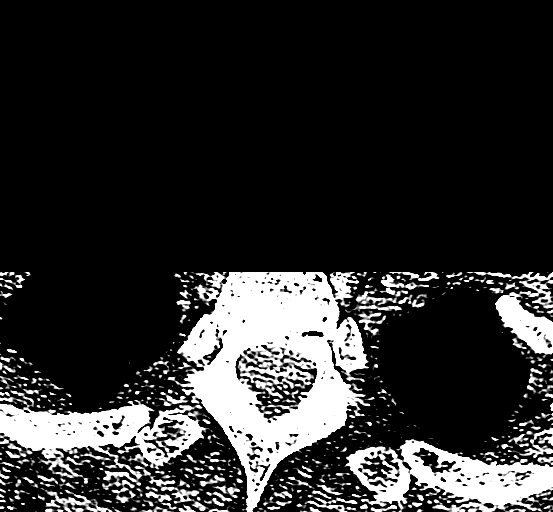
[im 7/75  bone]
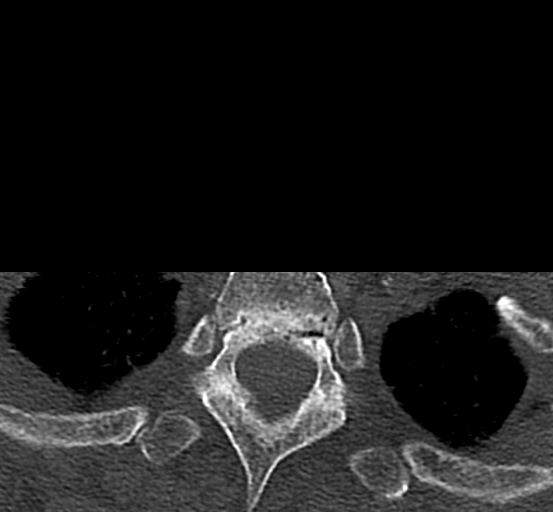
[im 13/75  brain]
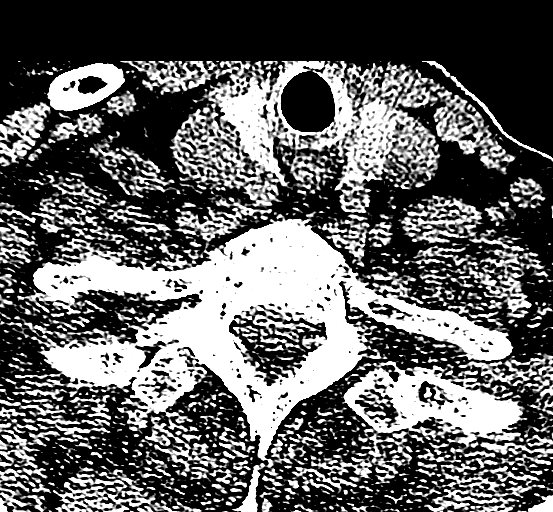
[im 25/75  brain]
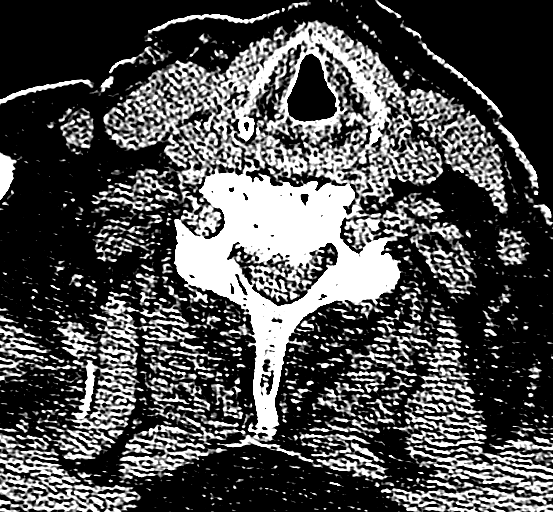
[im 31/75  brain]
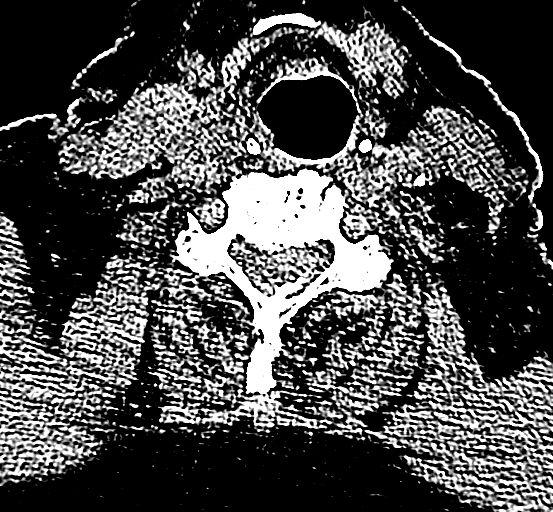
[im 38/75  brain]
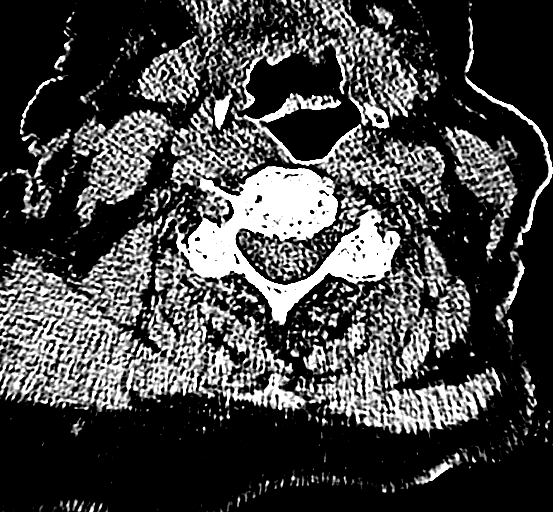
[im 38/75  bone]
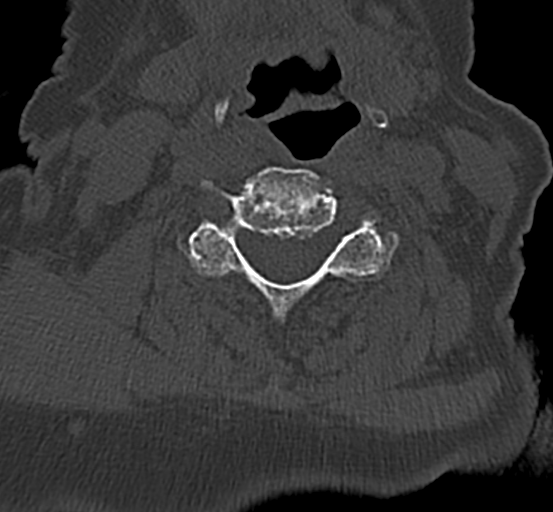
[im 44/75  brain]
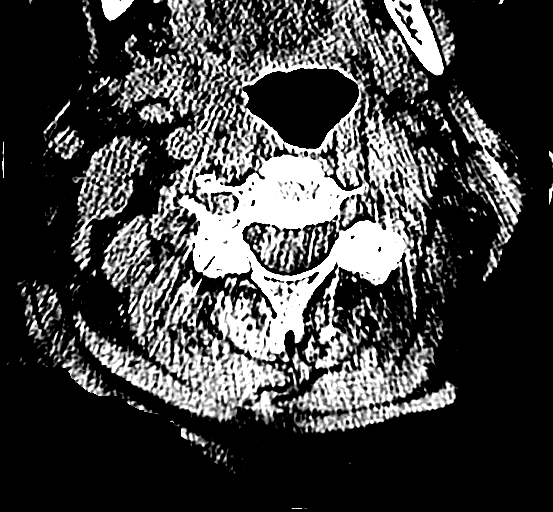
[im 50/75  brain]
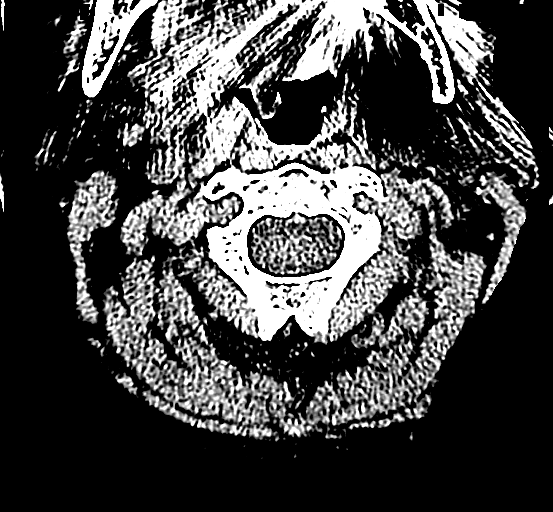
[im 62/75  brain]
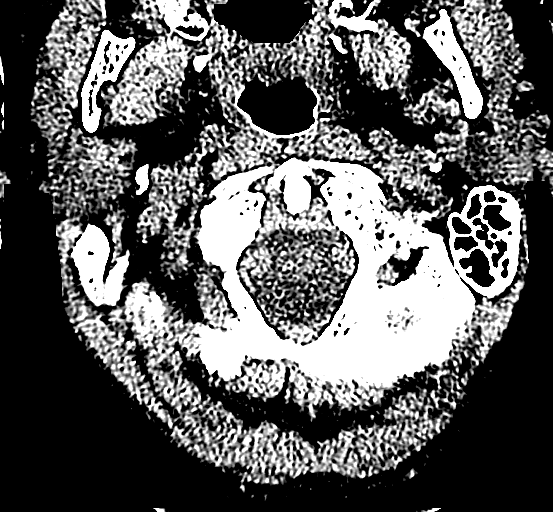
[im 68/75  brain]
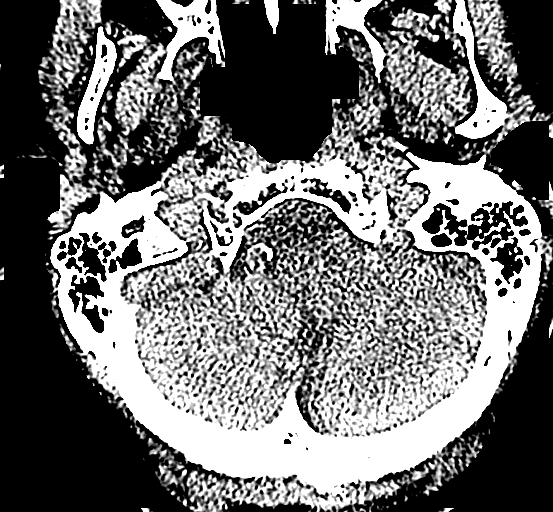
[im 68/75  bone]
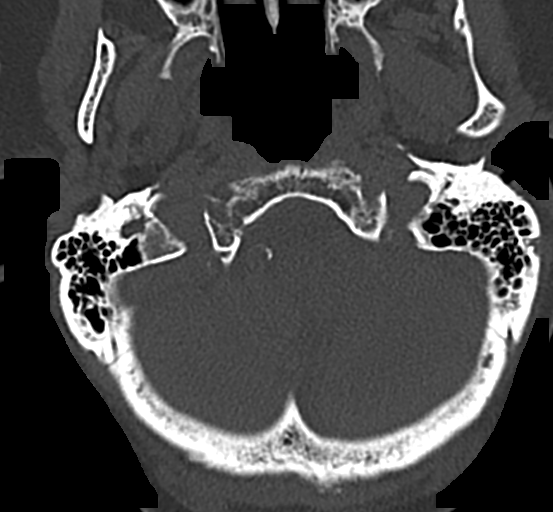

[13 of 47 positions shown; findings below may reference images not displayed]

FINDINGS: CT HEAD FINDINGS

There is chronic diffuse atrophy. Chronic bilateral periventricular
white matter small vessel ischemic changes are noted. There is no
midline shift, hydrocephalus, or mass. No acute hemorrhage or acute
transcortical infarct is identified. The bony calvarium is intact.
Minimal mucoperiosteal thickening of bilateral ethmoid and maxillary
sinuses are identified.

CT CERVICAL SPINE FINDINGS

There is no acute fracture or dislocation. Degenerative joint
changes of the spine are identified. The prevertebral soft tissues
are normal. There is no malalignment. There is scarring of the lung
apices.
IMPRESSION: No focal acute intracranial abnormality identified.

No acute fracture or dislocation of cervical spine.

Chronic diffuse atrophy and chronic bilateral periventricular white
matter small vessel ischemic change.

## 2017-08-11 IMAGING — DX DG WRIST COMPLETE 3+V*L*
4 series · 4 of 4 positions shown · non-contrast
Comparison: None

CLINICAL DATA: Unwitnessed fall yesterday, LEFT wrist pain, LEFT
hand swelling, initial encounter

EXAM:
LEFT WRIST - COMPLETE 3+ VIEW

[wrist pa]
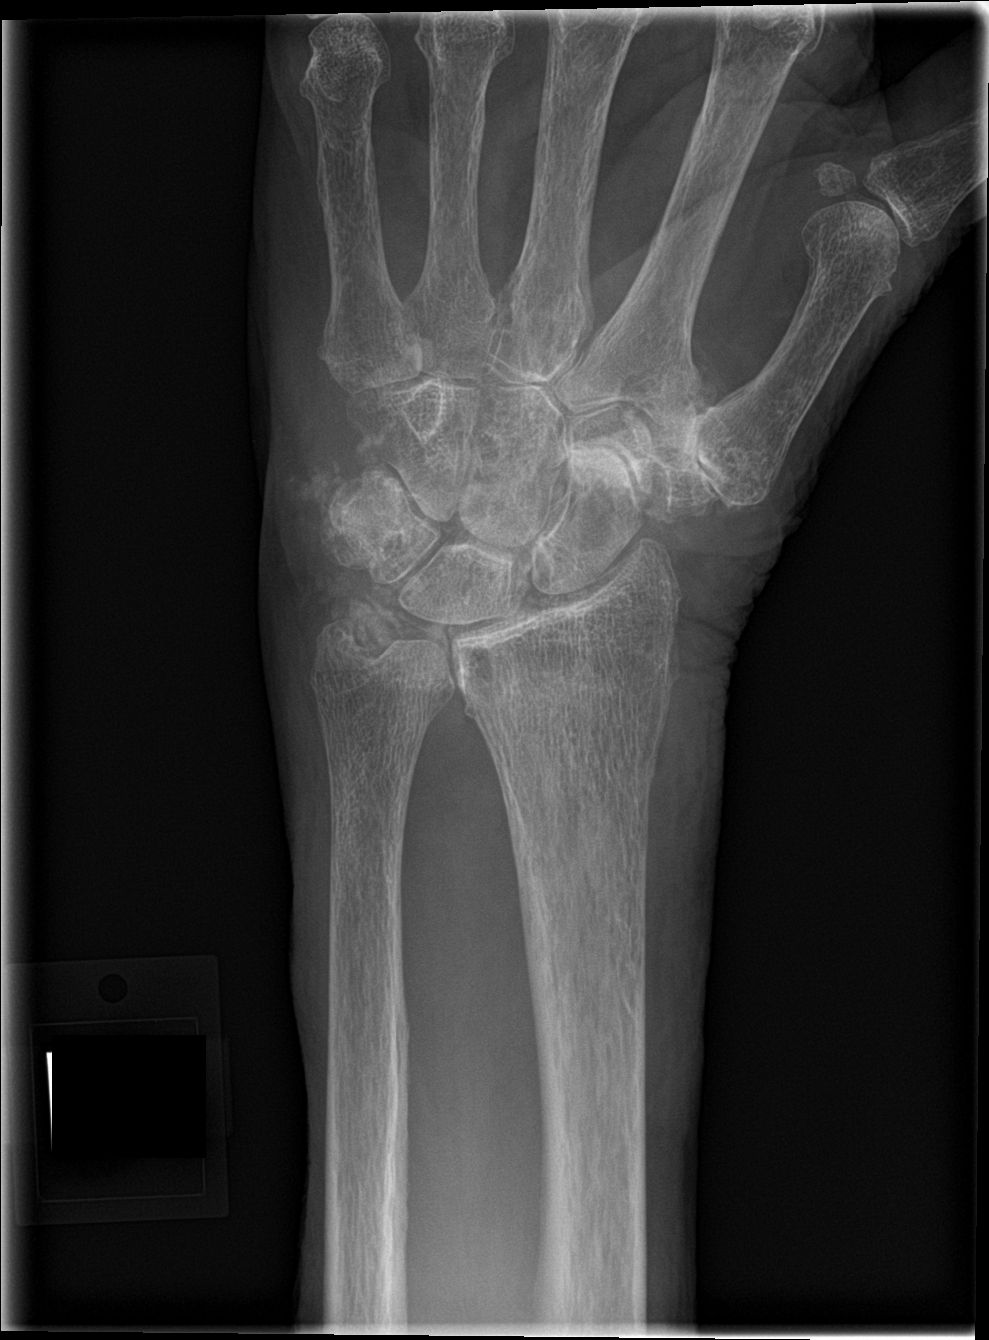

[wrist obl]
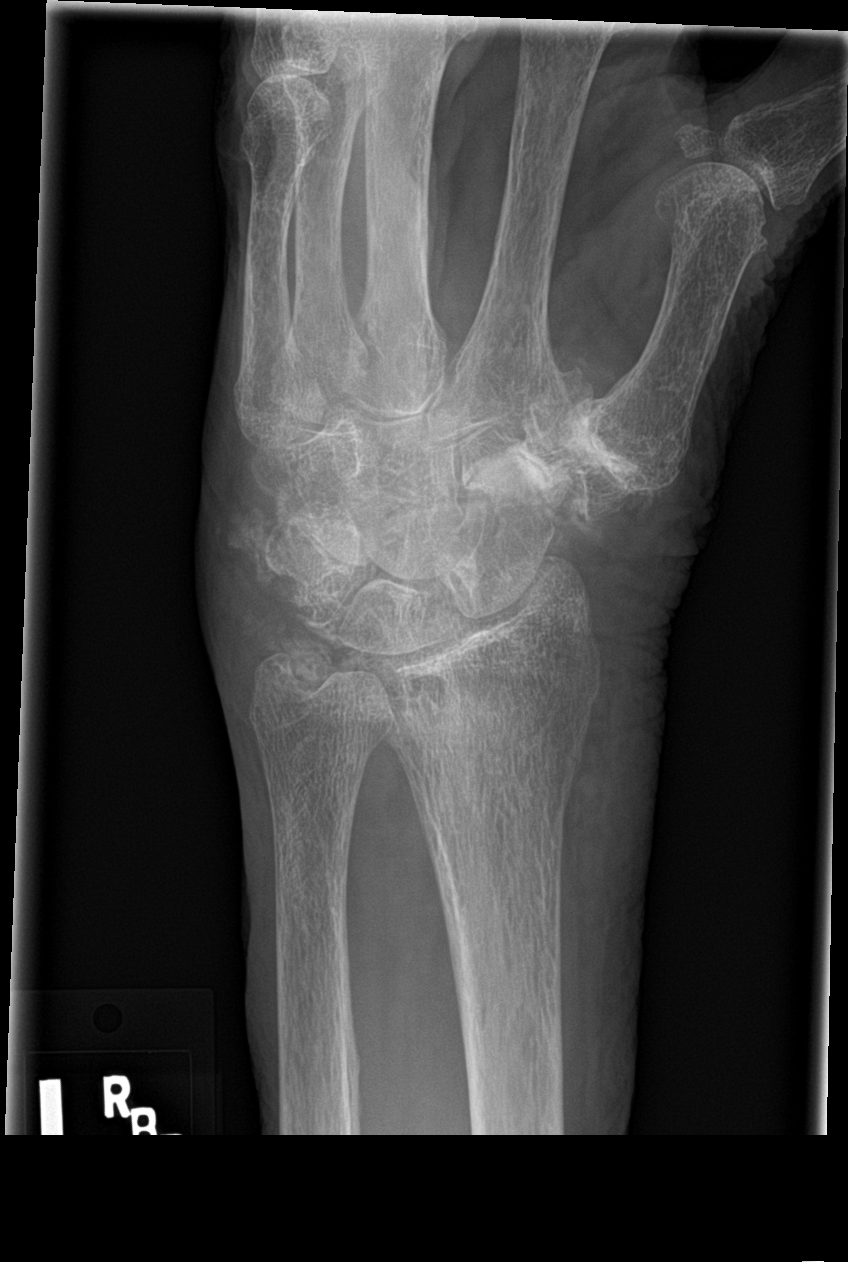

[wrist lat]
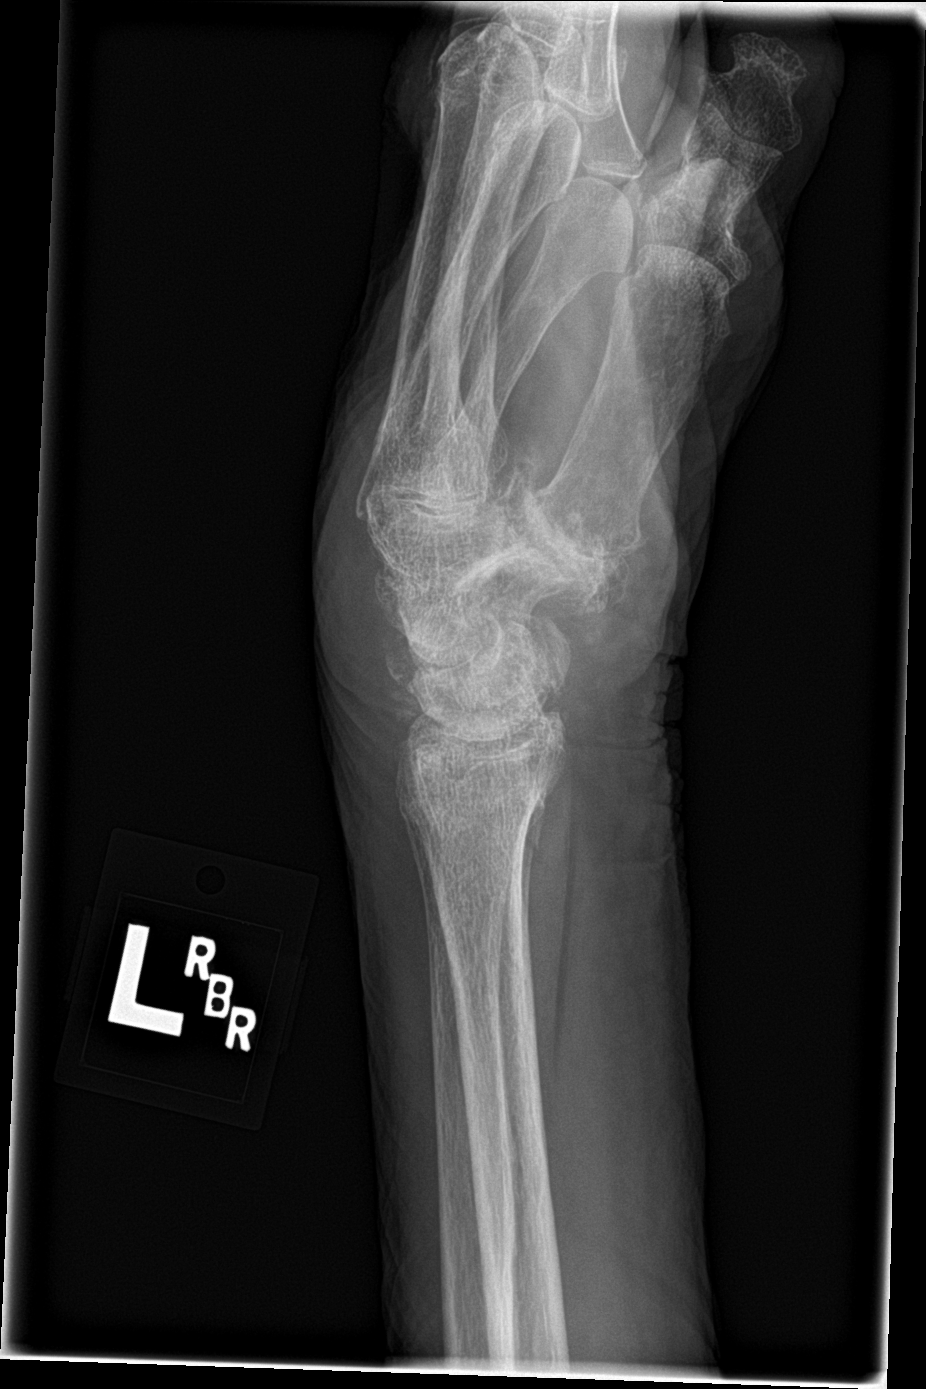

[wrist navicular]
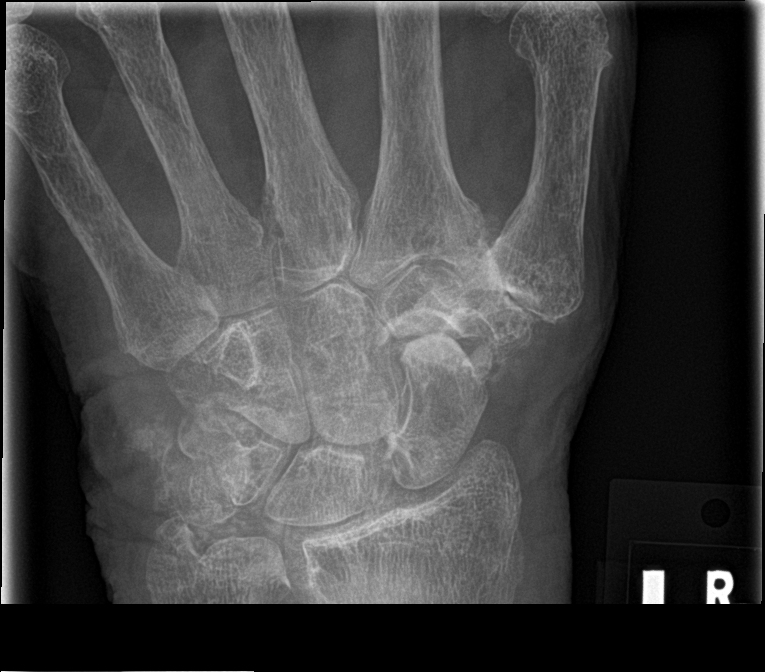

[4 of 4 positions shown; findings below may reference images not displayed]

FINDINGS: Marked osseous demineralization.

Joint space narrowing at first [facility] joint and STT joint.

Subchondral cystic change at ulnar margin of distal radius adjacent
to distal radioulnar joint.

Extensive chondrocalcinosis question CPPD.

Nondisplaced distal radial metaphyseal fracture without articular
extension.

No additional fracture, dislocation, or bone destruction.

Significant soft tissue swelling at wrist.
IMPRESSION: Nondisplaced distal LEFT radial metaphyseal fracture.

Osseous demineralization with scattered degenerative changes and
suspected CPPD.

## 2017-08-11 IMAGING — DX DG CHEST 2V
2 series · 2 of 2 positions shown · non-contrast
Comparison: None.

CLINICAL DATA: Unwitnessed fall, swelling in LEFT hand

EXAM:
CHEST  2 VIEW

[chest lat]
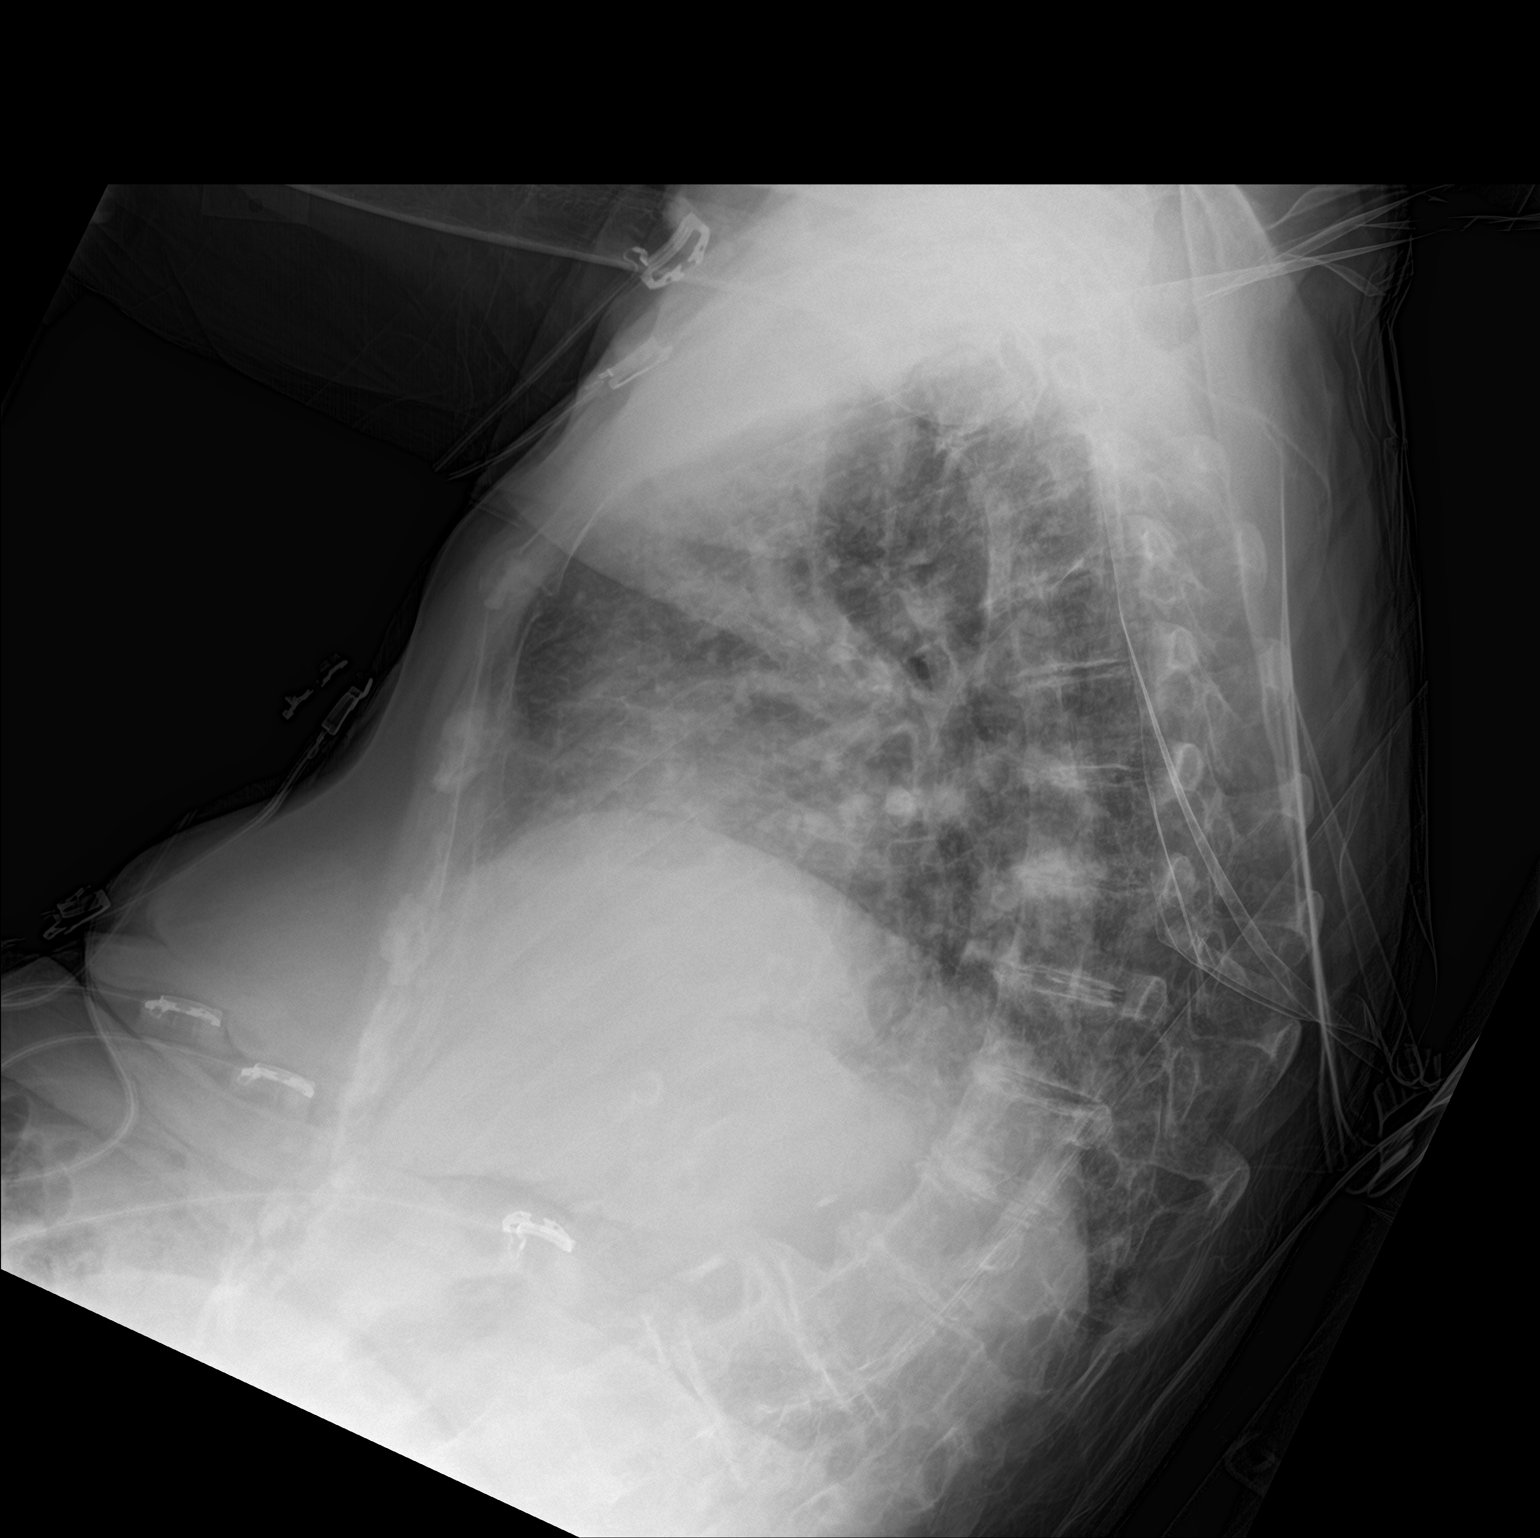

[chest ap]
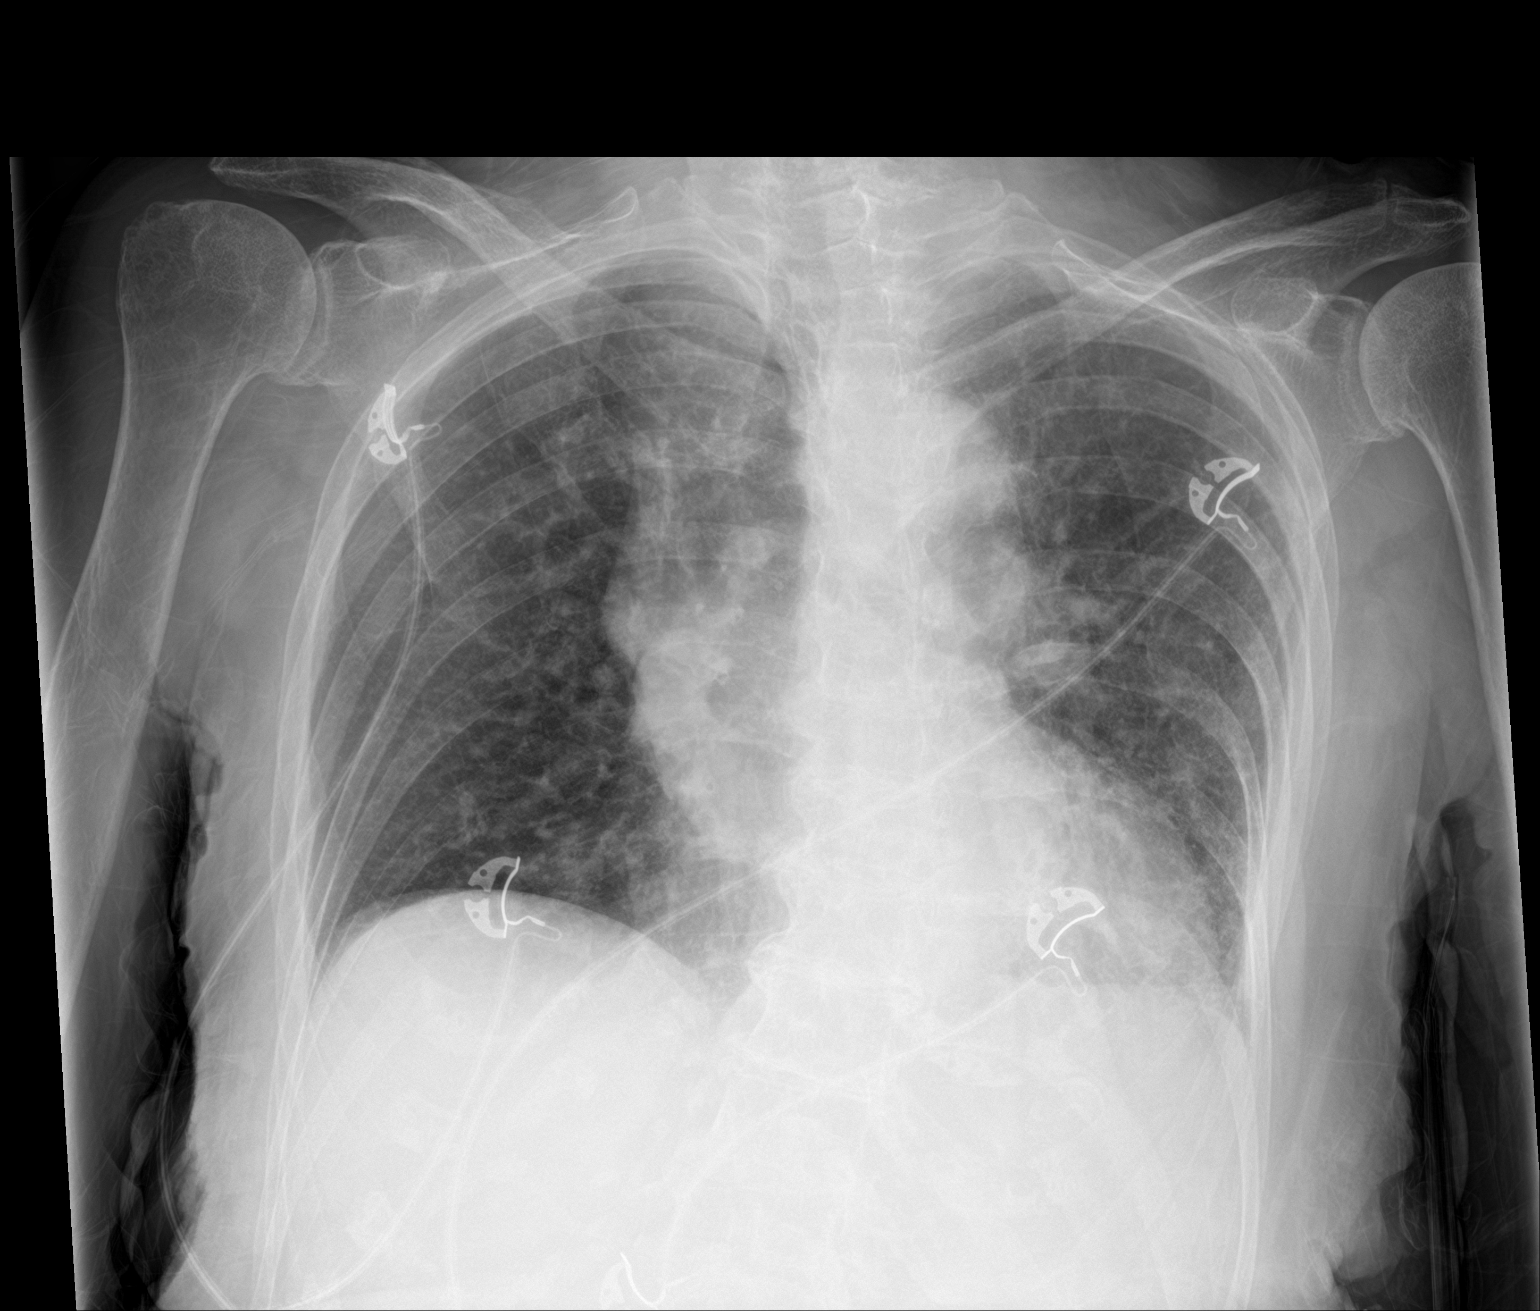

[2 of 2 positions shown; findings below may reference images not displayed]

FINDINGS: Normal cardiac silhouette. Ectatic aorta. Central venous pulmonary
congestion present. No effusion, infiltrate pneumothorax.
IMPRESSION: Central venous congestion and ectatic aorta.  No acute findings.
